# Patient Record
Sex: Female | Born: 1950 | Race: Black or African American | Hispanic: No | Marital: Single
Health system: Midwestern US, Community
[De-identification: ages and names within clinical notes are randomized; demographics above are authoritative.]

## PROBLEM LIST (undated history)

## (undated) DIAGNOSIS — R053 Chronic cough: Secondary | ICD-10-CM

## (undated) DIAGNOSIS — M51369 Other intervertebral disc degeneration, lumbar region without mention of lumbar back pain or lower extremity pain: Secondary | ICD-10-CM

## (undated) DIAGNOSIS — M5136 Other intervertebral disc degeneration, lumbar region: Secondary | ICD-10-CM

## (undated) DIAGNOSIS — R058 Other specified cough: Secondary | ICD-10-CM

## (undated) DIAGNOSIS — K7469 Other cirrhosis of liver: Principal | ICD-10-CM

## (undated) DIAGNOSIS — B1920 Unspecified viral hepatitis C without hepatic coma: Secondary | ICD-10-CM

## (undated) DIAGNOSIS — K7460 Unspecified cirrhosis of liver: Secondary | ICD-10-CM

## (undated) DIAGNOSIS — F329 Major depressive disorder, single episode, unspecified: Secondary | ICD-10-CM

## (undated) DIAGNOSIS — F32A Depression, unspecified: Secondary | ICD-10-CM

## (undated) HISTORY — PX: KNEE SURGERY: SHX244

## (undated) HISTORY — PX: ABDOMINAL HYSTERECTOMY: SHX81

## (undated) HISTORY — PX: HIP SURGERY: SHX245

## (undated) HISTORY — PX: BACK SURGERY: SHX140

---

## 1998-03-28 ENCOUNTER — Emergency Department (HOSPITAL_COMMUNITY): Admission: EM | Admit: 1998-03-28 | Discharge: 1998-03-28 | Payer: Self-pay | Admitting: Internal Medicine

## 1998-03-30 ENCOUNTER — Emergency Department (HOSPITAL_COMMUNITY): Admission: EM | Admit: 1998-03-30 | Discharge: 1998-03-30 | Payer: Self-pay | Admitting: Emergency Medicine

## 1998-04-03 ENCOUNTER — Emergency Department (HOSPITAL_COMMUNITY): Admission: EM | Admit: 1998-04-03 | Discharge: 1998-04-03 | Payer: Self-pay

## 2001-11-23 ENCOUNTER — Ambulatory Visit (HOSPITAL_COMMUNITY): Admission: RE | Admit: 2001-11-23 | Discharge: 2001-11-23 | Payer: Self-pay | Admitting: Family Medicine

## 2001-11-23 ENCOUNTER — Encounter: Payer: Self-pay | Admitting: Family Medicine

## 2001-12-31 ENCOUNTER — Encounter (HOSPITAL_BASED_OUTPATIENT_CLINIC_OR_DEPARTMENT_OTHER): Payer: Self-pay | Admitting: General Surgery

## 2002-01-04 ENCOUNTER — Encounter (INDEPENDENT_AMBULATORY_CARE_PROVIDER_SITE_OTHER): Payer: Self-pay | Admitting: *Deleted

## 2002-01-04 ENCOUNTER — Encounter (HOSPITAL_BASED_OUTPATIENT_CLINIC_OR_DEPARTMENT_OTHER): Payer: Self-pay | Admitting: General Surgery

## 2002-01-04 ENCOUNTER — Ambulatory Visit (HOSPITAL_COMMUNITY): Admission: RE | Admit: 2002-01-04 | Discharge: 2002-01-05 | Payer: Self-pay | Admitting: General Surgery

## 2002-07-12 ENCOUNTER — Emergency Department (HOSPITAL_COMMUNITY): Admission: EM | Admit: 2002-07-12 | Discharge: 2002-07-12 | Payer: Self-pay | Admitting: Emergency Medicine

## 2002-07-12 ENCOUNTER — Encounter: Payer: Self-pay | Admitting: Emergency Medicine

## 2002-08-14 ENCOUNTER — Ambulatory Visit (HOSPITAL_COMMUNITY): Admission: RE | Admit: 2002-08-14 | Discharge: 2002-08-14 | Payer: Self-pay | Admitting: *Deleted

## 2002-09-07 ENCOUNTER — Ambulatory Visit (HOSPITAL_COMMUNITY): Admission: RE | Admit: 2002-09-07 | Discharge: 2002-09-07 | Payer: Self-pay | Admitting: Orthopedic Surgery

## 2002-10-19 ENCOUNTER — Encounter: Admission: RE | Admit: 2002-10-19 | Discharge: 2002-12-28 | Payer: Self-pay | Admitting: Orthopedic Surgery

## 2003-02-09 ENCOUNTER — Emergency Department (HOSPITAL_COMMUNITY): Admission: EM | Admit: 2003-02-09 | Discharge: 2003-02-09 | Payer: Self-pay | Admitting: Emergency Medicine

## 2003-05-26 ENCOUNTER — Ambulatory Visit (HOSPITAL_COMMUNITY): Admission: RE | Admit: 2003-05-26 | Discharge: 2003-05-26 | Payer: Self-pay | Admitting: Family Medicine

## 2004-06-12 ENCOUNTER — Ambulatory Visit: Payer: Self-pay | Admitting: Family Medicine

## 2004-10-11 ENCOUNTER — Ambulatory Visit: Payer: Self-pay | Admitting: Internal Medicine

## 2004-10-31 ENCOUNTER — Ambulatory Visit: Payer: Self-pay | Admitting: Family Medicine

## 2005-05-29 ENCOUNTER — Ambulatory Visit: Payer: Self-pay | Admitting: Family Medicine

## 2005-06-18 ENCOUNTER — Ambulatory Visit: Payer: Self-pay | Admitting: *Deleted

## 2005-07-16 ENCOUNTER — Ambulatory Visit: Payer: Self-pay | Admitting: Family Medicine

## 2005-07-23 ENCOUNTER — Encounter: Admission: RE | Admit: 2005-07-23 | Discharge: 2005-09-13 | Payer: Self-pay | Admitting: Family Medicine

## 2005-08-20 ENCOUNTER — Ambulatory Visit: Payer: Self-pay | Admitting: Family Medicine

## 2005-09-24 ENCOUNTER — Ambulatory Visit: Payer: Self-pay | Admitting: Family Medicine

## 2005-09-25 ENCOUNTER — Ambulatory Visit (HOSPITAL_COMMUNITY): Admission: RE | Admit: 2005-09-25 | Discharge: 2005-09-25 | Payer: Self-pay | Admitting: Family Medicine

## 2005-10-28 ENCOUNTER — Ambulatory Visit (HOSPITAL_COMMUNITY): Admission: RE | Admit: 2005-10-28 | Discharge: 2005-10-28 | Payer: Self-pay | Admitting: Family Medicine

## 2005-10-28 ENCOUNTER — Ambulatory Visit: Payer: Self-pay | Admitting: Family Medicine

## 2005-11-26 ENCOUNTER — Ambulatory Visit: Payer: Self-pay | Admitting: Family Medicine

## 2005-12-20 ENCOUNTER — Ambulatory Visit: Payer: Self-pay | Admitting: Family Medicine

## 2006-01-30 ENCOUNTER — Ambulatory Visit: Payer: Self-pay | Admitting: Family Medicine

## 2006-03-12 ENCOUNTER — Ambulatory Visit: Payer: Self-pay | Admitting: Family Medicine

## 2006-04-24 ENCOUNTER — Ambulatory Visit: Payer: Self-pay | Admitting: Family Medicine

## 2006-05-28 ENCOUNTER — Ambulatory Visit: Payer: Self-pay | Admitting: Family Medicine

## 2006-06-02 ENCOUNTER — Ambulatory Visit (HOSPITAL_COMMUNITY): Admission: RE | Admit: 2006-06-02 | Discharge: 2006-06-02 | Payer: Self-pay | Admitting: Family Medicine

## 2006-06-23 ENCOUNTER — Ambulatory Visit: Payer: Self-pay | Admitting: Family Medicine

## 2006-08-12 ENCOUNTER — Ambulatory Visit: Payer: Self-pay | Admitting: Family Medicine

## 2006-09-23 ENCOUNTER — Ambulatory Visit: Payer: Self-pay | Admitting: Family Medicine

## 2006-10-28 ENCOUNTER — Ambulatory Visit: Payer: Self-pay | Admitting: Family Medicine

## 2006-12-05 ENCOUNTER — Ambulatory Visit: Payer: Self-pay | Admitting: Family Medicine

## 2007-01-09 ENCOUNTER — Ambulatory Visit: Payer: Self-pay | Admitting: Family Medicine

## 2007-01-13 ENCOUNTER — Inpatient Hospital Stay (HOSPITAL_COMMUNITY): Admission: RE | Admit: 2007-01-13 | Discharge: 2007-01-16 | Payer: Self-pay | Admitting: Orthopedic Surgery

## 2007-02-20 ENCOUNTER — Ambulatory Visit: Payer: Self-pay | Admitting: Family Medicine

## 2007-03-31 ENCOUNTER — Ambulatory Visit: Payer: Self-pay | Admitting: Family Medicine

## 2007-04-15 ENCOUNTER — Encounter (INDEPENDENT_AMBULATORY_CARE_PROVIDER_SITE_OTHER): Payer: Self-pay | Admitting: *Deleted

## 2007-08-06 ENCOUNTER — Ambulatory Visit: Payer: Self-pay | Admitting: Family Medicine

## 2007-09-10 ENCOUNTER — Encounter: Admission: RE | Admit: 2007-09-10 | Discharge: 2007-09-10 | Payer: Self-pay | Admitting: Orthopedic Surgery

## 2007-10-20 ENCOUNTER — Encounter: Admission: RE | Admit: 2007-10-20 | Discharge: 2007-10-20 | Payer: Self-pay | Admitting: Gastroenterology

## 2007-10-25 ENCOUNTER — Encounter: Admission: RE | Admit: 2007-10-25 | Discharge: 2007-10-25 | Payer: Self-pay | Admitting: Gastroenterology

## 2007-12-01 ENCOUNTER — Encounter: Admission: RE | Admit: 2007-12-01 | Discharge: 2007-12-31 | Payer: Self-pay | Admitting: Orthopedic Surgery

## 2008-03-21 ENCOUNTER — Encounter: Admission: RE | Admit: 2008-03-21 | Discharge: 2008-05-19 | Payer: Self-pay | Admitting: Orthopedic Surgery

## 2009-03-16 ENCOUNTER — Ambulatory Visit: Payer: Self-pay | Admitting: Obstetrics and Gynecology

## 2010-03-06 ENCOUNTER — Encounter
Admission: RE | Admit: 2010-03-06 | Discharge: 2010-03-06 | Payer: Self-pay | Admitting: Physical Medicine and Rehabilitation

## 2010-12-11 NOTE — Op Note (Signed)
Amanda Monroe, Amanda Monroe NO.:  [id number]   MEDICAL RECORD NO.:  [id number]          PATIENT TYPE:  INP   LOCATION:  2855                         FACILITY:  [facility]   PHYSICIAN:  Nelda Severe, MD      DATE OF BIRTH:  1950/09/29   DATE OF PROCEDURE:  01/13/2007  DATE OF DISCHARGE:                               OPERATIVE REPORT   SURGEON:  Nelda Severe, M.D.   ASSISTANT:  Lianne Cure, P.A.-C.   PREOPERATIVE DIAGNOSIS:  Lumbar spondylosis, lumbar spinal stenosis L4-  L5.   POSTOPERATIVE DIAGNOSIS:  Lumbar spondylosis, lumbar spinal stenosis L4-  L5.   OPERATIVE PROCEDURE:  L4-L5 bilateral laminectomy; posterior  posterolateral fusion L3-L4, L4-L5, L5-S1 with autogenous iliac crest  graft (right side), screws and rods.   OPERATIVE NOTE:  The patient was placed under general endotracheal  anesthesia.  A Foley catheter was placed in the bladder.  Sequential  compression devices were placed on both lower extremities.  A gram of  vancomycin had been infused intravenously.  The patient was positioned  prone on the [place] table.  Care was taken to position the upper  extremities so as to avoid hyperflexion and abduction of the shoulders  so as to avoid hyperflexion of the elbows.  The upper extremities were  padded from hands to axilla with foam.  The thighs, knees, shins, and  feet were padded with pillows.  The midline in the lumbosacral area was  marked on the skin with a skin marker as well as an oblique mark made on  the right side over the posterior iliac crest.  The lumbar area was  prepped with DuraPrep and draped in rectangular fashion.  The drapes  were secured with Ioban.   A midline incision was scored into the dermis and subcutaneous tissue  injected a mixture of 0.25% Marcaine with epinephrine and 1% plain  lidocaine.  Dissection was carried down to the spinous processes using  cutting current.  The paraspinal muscles were reflected bilaterally to  the transverse processes of L3, L4, L5 and the ala of the sacrum  bilaterally.  Cross table lateral radiograph was taken with Kochers  marking the spinous processes.  This confirmed our level.   Pedicle holes were then made on the left side at S1, L5, L4 and L3, in  the usual fashion:  A small amount of the base of the supra-articular  process was removed with a Leksell rongeur to identify the posterior  pedicle.  The pedicle was then perforated with an awl and a 3.5 mm drill  bit used to make a hole through the pedicle in the vertebral body.  Each  hole was carefully palpated with a ball tip probe to make sure it was  circumferentially intact and sounded for depths and the depths recorded.  Each hole was then injected with FloSeal and a radio-opaque marker  placed.  I then switched to the right side of the patient and made holes  again at the same levels S1 distally through L3 proximally to the  fashion described.  Cross table lateral radiograph was taken.  While waiting for the radiograph to be developed, a right posterior  iliac crest graft was harvested through a separate incision.  An oblique  incision was made just lateral to the iliac crest.  The gluteal fascia  and subcutaneous layer were injected with the same mixture of local  anesthetic mentioned above.  Dissection was carried down until the  gluteal fascial insertion into the iliac crest was identified and it was  detached using cutting current.  Muscle was elevated off the outer table  of the ilium.  Acetabular reamers were then used to remove a moderately  large quantity of graft.  The wound was irrigated with saline, packed  with Gelfoam, and then closed with three figure-of-eight #1 Vicryl  sutures through the fascia.   We then prepared the ala of the sacrum and the transverse process of L5,  L4, L3, and the lateral aspect of the supra-articular processes for  placement of graft.  They were decorticated using a  combination of  Leksell rongeur and osteotomes.  Half the graft was then placed from the  ala of the sacrum distally to the L3 transverse process and lateral  aspect of the supra-articular process proximally on the right side.  Screws were then inserted from S1 distally through L3 proximally.  At  S1, the 7.5 mm screw stripped out and a larger 8 mm screw was then  inserted and then broad provisionally attached.  Cross-table lateral  radiograph was taken which showed satisfactory position of screws.  We  performed the same procedure on the left side, decorticating the ala of  the sacrum and the transverse processes and lateral aspects of the supra-  articular processes.  The rest of the graft was placed posterolaterally  from L3 through S1 on the left side.  Screws were then inserted and then  rod contoured and placed.  Cross-table lateral radiograph showed  satisfactory position of the screws.  All the couplings were then  torqued on both sides.  FloSeal was injected over the laminectomy defect  after it was carefully inspected to make sure that no graft had gone  into it.   I failed to dictation in the proper order the laminectomy.  This was  done subsequent to placing all the screw holes, but prior to placing  hardware.  We used an acetabular reamer to thin down the lamina,  particularly at L4-L5, in order to harvest more graft.  We then  completed the laminectomy bilaterally at L4 including facetectomies.  The L4 nerve roots bilaterally were well decompressed into the neural  foramina and the L5 nerve roots decompressed on the right side and  lateral recess at L5.  A probe could be admitted on the left side and  there was no need to perform a decompression.   We then carried on with harvest of graft and insertion of screws as  dictated above.   Subsequent to torquing all the couplings and having a final x-ray, a 15  gauge Blake drain was placed subfascially and brought out through  the  skin through the right side.  It was secured with a 2-0 nylon suture in  basket weave fashion.  The thoracolumbar fascia was then closed using  continuous #1 Vicryl suture in a running fashion.  A 1/8 inch Hemovac  drain was placed in the subcutaneous layer of the central wound and  brought out through the bone graft harvest wound laterally and through  the skin proximally.  The drain was  secured with a 2-0 nylon suture.  The subcutaneous layer of both wounds was closed using interrupted  inverted 2-0 Vicryl suture.  The skin of both wounds was closed using  continuous subcuticular 3-0 undyed Vicryl suture.  The skin edges were  reinforced with Steri-Strips and an antibiotic ointment dressing applied  and secured with OpSite.   The blood loss estimated at 250 mL.  There was not enough blood to spin  down in the Cell Saver.  The patient was stable throughout the  procedure.  There were no intraoperative complications.  Sponge and  needle counts were correct.      Nelda Severe, MD  Electronically Signed     MT/MEDQ  D:  01/13/2007  T:  01/13/2007  Job:  [id number]

## 2010-12-11 NOTE — Group Therapy Note (Signed)
NAME:  Amanda Monroe, Amanda Monroe NO.:  [id number]   MEDICAL RECORD NO.:  [id number]          PATIENT TYPE:  WOC   LOCATION:  [facility]                   FACILITY:  [facility]   PHYSICIAN:  Deirdre Poe, CNM       DATE OF BIRTH:  Jun 28, 1951   DATE OF SERVICE:                                  CLINIC NOTE   HISTORY:  She is concerned that she has not had any ongoing primary care  and was last seen by a physician about 4 years ago, at which time she  had a normal Pap smear; however, she has had a normal mammogram this  year, she thinks a couple of months ago.  Her other concern is hot  flashes which she has had from time to time over the past several years  but for the last year or so they think it is becoming more frequent.  She thinks she has about 4 or 5 episodes a day and also wakes up  sweating at night.   ALLERGIES:  None.   MEDICATIONS:  Hydrocodone and occasional use for back and hip pain  postsurgical.  Zolpidem 10 mg 1 a day.   IMMUNIZATIONS:  Usual childhood immunizations including tetanus.   MENSTRUAL HISTORY:  Not applicable.   CONTRACEPTIVES:  None.  She is not currently sexually active and lives  alone.   OBSTETRIC HISTORY:  She had three term vaginal deliveries.  Her middle  son deceased at age 64 of meningitis.   GYNECOLOGIC HISTORY:  She believes she had a Pap smear done at  [facility] about 4 years ago and has never had an abnormal.  She does  give her date of last mammogram as August 2010.  She had a colonoscopy  in 2006.  She had a partial hysterectomy in 1976 which was done for  dysfunctional bleeding of benign nature.  She had extensive back surgery  and knee and hip surgery.  She plans to make an appointment with her  orthopedist in the near future as her symptoms are recurring.   Family history is significant for father with prostate cancer, one  brother with lung cancer, one with colon cancer.   Personal medical history is significant problem in  her back that  required surgical repair and a knee surgery for a benign mass.   SOCIAL HISTORY:  Does not work.  Quit smoking several years ago, has  about 10 pack-years.  Does not drink or use any caffeinated beverages.  No history of abuse.   REVIEW OF SYSTEMS:  Twelve point is positive for hot flashes as above  and some weight fluctuations that sound minor of just a few pounds.   PHYSICAL EXAMINATION:  VITAL SIGNS:  Temp 97.7, pulse 75, BP 120/81,  weight 163.  GENERAL:  Pleasant AA female in no distress.  HEENT:  Atraumatic, normocephalic.  NECK:  Thyroid not enlarged.  HEART:  RRR without murmur.  LUNGS:  CTA bilateral.  BREASTS:  There is a symmetric shallow indentation horizontally across  both areola, and she states this has been present and unchanged for  years.  No other discrete dimpling.  No discrete masses or  lymphadenopathy.  ABDOMEN:  Soft, nontender.  No masses palpated.  BACK:  Scarring from past spinal surgeries.  EXTREMITIES:  Without edema.  PELVIC:  External genitalia is significant for 2-mm, flat, black nevus  versus comedones.  The patient states it has been there for months and  has not changed.  It is in the mid pubic area.  Otherwise, NEFG.  Vagina, pale, decrease rugae.  Cuff visualized.  Minimal discharge.  Palpation of vulva and vagina without masses.   ASSESSMENT:  Menopausal with significant hot flashes.   HEALTHCARE MAINTENANCE:  Needs to begin vitamins and will get labs as  well.   Plan is for starting multivitamin.  She can continue the vitamin E which  she takes sporadically.  We advised calcium 1200 mg per day and D 3000  mg a day.  Discussed diet, exercise, and weight loss.  She needs to  increase fruits and vegetables.  Decrease saturated fats and junk food.  Needs to start doing some cardia and strength exercises and reviewed  breast self-exam.  Continue to use seat belt all the time.  In  consultation with Dr. Okey Dupre, we will start her  on estradiol 1 mg p.o.  daily and have her return in 2 months to see how she is doing.  Meanwhile, she will get a fasting lipid profile, TSH, hemoglobin A1c,  and CBC.           ______________________________  Caren Griffins, CNM     DP/MEDQ  D:  03/16/2009  T:  03/17/2009  Job:  [id number]

## 2010-12-11 NOTE — Discharge Summary (Signed)
NAMEALISSANDRA, GEOFFROY NO.:  [id number]   MEDICAL RECORD NO.:  [id number]          PATIENT TYPE:  INP   LOCATION:  5029                         FACILITY:  [facility]   PHYSICIAN:  Nelda Severe, MD      DATE OF BIRTH:  05-28-1951   DATE OF ADMISSION:  01/13/2007  DATE OF DISCHARGE:  01/16/2007                               DISCHARGE SUMMARY   This 60 year old lady is admitted for management of spinal stenosis and  lumbar spondylosis.  At admission, she was taken to the operating room  where an L4-5 laminectomy and L3-S1 fusion was carried out using  hypnogenous bone graft in the right posterior iliac crest.  Postoperatively, there have been no complications and there were no  intraoperative complications.  She has ambulated well with the use of a  walker.  At the present time, she is tolerating a regular diet, has  passed flatus and is ambulatory with a walker.   At this time, she is being discharged with a walker and a 3:1 commode.  The dressing has been changed this morning.  Drains were removed  previously.  The incision is dry without drainage.   [facility] has been contacted with regards to her DNEs and a  home health aide.   She will followed in the office in approximately 1 month's time.  She is  to avoid bending and lifting.  She can walk as much as she wants.  She  is being discharged home with a prescription for Norco 10 one to two  q.h.s., maximum 6 per day with 120 tablets.  She already has Flexeril at  home.  She will take 10 mg p.r.n. for spasms.   FINAL DIAGNOSIS:  Lumbar spondylosis, lumbar spinal stenosis.   See above for discharge instructions and DNEs.   She is also to call us for any wound drainage or persistent fever and  she will be seen sooner.      Nelda Severe, MD  Electronically Signed     MT/MEDQ  D:  01/16/2007  T:  01/16/2007  Job:  [id number]

## 2010-12-11 NOTE — H&P (Signed)
Amanda Monroe, Amanda Monroe             ACCOUNT NO.:  [id number]   MEDICAL RECORD NO.:  [id number]          PATIENT TYPE:  INP   LOCATION:  NA                           FACILITY:  [facility]   PHYSICIAN:  Lianne Cure, P.A.  DATE OF BIRTH:  1950/08/10   DATE OF ADMISSION:  DATE OF DISCHARGE:                              HISTORY & PHYSICAL   CHIEF COMPLAINT:  Lower back pain, right buttock pain, occasionally  radiating into right greater than left leg.   HISTORY OF PRESENT ILLNESS:  She came to [place] on October 15, 2006, chief  complaint of low back pain, right buttock pain going on since 2006,  worsening in 2007 when a swing broke on her.  She fell to the ground.  She was seen at the request of her family physician, Dr. Dow Adolph.   ALLERGIES:  She has no known drug allergies.   CURRENT MEDICATIONS:  1. Neurontin 300 mg at bedtime.  2. Percocet p.r.n. for pain control.  3. Flexeril 10 mg p.r.n. for muscle spasms.  4. Mobic.   PAST MEDICAL HISTORY:  1. Anxiety.  2. Arthritis.  3. Asthma.  4. Depression.  5. Liver disease.   PAST SURGICAL HISTORY:  1. Left knee in 2004.  2. Hysterectomy in 1976.   SOCIAL HISTORY:  She does not smoke or drink.   FAMILY HISTORY:  Includes prostate cancer and lung disease in her  brother.   REVIEW OF SYSTEMS:  She reports no recent weight loss or weight gain.  She does wear reading glasses.  She has occasional nose bleeds.  No  change in vision or hearing.  No difficulty swallowing.  No shortness of  breath.  No chest pain.  No poor appetite.  She does have some gum  trouble and toothaches occasionally.  No nausea, vomiting or diarrhea.  No melena.  No seizures.  No blackouts.  No frequent headaches.   PHYSICAL EXAMINATION:  VITAL SIGNS:  Today, her temperature was 97.7,  pulse 81, respirations 18, blood pressure 127/77.  She is 5 foot 7,  weighs 175 pounds.  GENERAL:  She is well-developed, well-nourished.  HEENT:  Pupils are equal,  round, reactive to light.  NECK:  Has full range of motion actively.  It is supple, nontender to  palpation.  CHEST:  Clear to auscultation.  No wheezing.  HEART:  Regular rate and rhythm.  No murmurs.  ABDOMEN:  Soft.  Positive bowel sounds on auscultation.  EXTREMITIES:  Today on exam are neurovascular and motor intact  bilaterally equal.  SKIN:  Clean and dry.  No openings.  No poor healings.   MRI RESULTS:  She has spinal stenosis L3-4, L4-5, with spondylosis at L5-  S1.   PLAN:  Lumbar effusion L3-S1 with laminectomy at L4-5, L3-4 with iliac  crest bone graft by Dr. Herbert Pun on January 13, 2007.      Lianne Cure, P.A.     MC/MEDQ  D:  01/12/2007  T:  01/12/2007  Job:  [id number]

## 2011-01-31 ENCOUNTER — Emergency Department (HOSPITAL_COMMUNITY): Payer: Medicaid Other

## 2011-01-31 ENCOUNTER — Emergency Department (HOSPITAL_COMMUNITY)
Admission: EM | Admit: 2011-01-31 | Discharge: 2011-01-31 | Disposition: A | Discharge status: Home / Self Care | Payer: Medicaid Other | Attending: Emergency Medicine | Admitting: Emergency Medicine

## 2011-01-31 DIAGNOSIS — M545 Low back pain, unspecified: Secondary | ICD-10-CM | POA: Insufficient documentation

## 2011-01-31 DIAGNOSIS — M25569 Pain in unspecified knee: Secondary | ICD-10-CM | POA: Insufficient documentation

## 2011-01-31 DIAGNOSIS — W010XXA Fall on same level from slipping, tripping and stumbling without subsequent striking against object, initial encounter: Secondary | ICD-10-CM | POA: Insufficient documentation

## 2011-01-31 DIAGNOSIS — Z981 Arthrodesis status: Secondary | ICD-10-CM | POA: Insufficient documentation

## 2011-05-15 LAB — BASIC METABOLIC PANEL
BUN: 3 — ABNORMAL LOW
BUN: 6
CO2: 28
Calcium: 8.1 — ABNORMAL LOW
Calcium: 8.3 — ABNORMAL LOW
Calcium: 8.5
Calcium: 8.5
Chloride: 108
Chloride: 108
Creatinine, Ser: 0.57
Creatinine, Ser: 0.62
GFR calc non Af Amer: >60
GFR calc non Af Amer: >60
Glucose, Bld: 107 — ABNORMAL HIGH
Glucose, Bld: 111 — ABNORMAL HIGH
Glucose, Bld: 118 — ABNORMAL HIGH
Glucose, Bld: 135 — ABNORMAL HIGH
Potassium: 3.9
Sodium: 139
Sodium: 139
Sodium: 142
Sodium: 144

## 2011-05-15 LAB — URINALYSIS, ROUTINE W REFLEX MICROSCOPIC
Hgb urine dipstick: NEGATIVE
Ketones, ur: NEGATIVE
Leukocytes, UA: NEGATIVE
Nitrite: NEGATIVE
Urobilinogen, UA: 4 — ABNORMAL HIGH

## 2011-05-15 LAB — CBC
HCT: 29.9 — ABNORMAL LOW
HCT: 30.7 — ABNORMAL LOW
HCT: 42.8
Hemoglobin: 10 — ABNORMAL LOW
Hemoglobin: 10.3 — ABNORMAL LOW
MCHC: 33.4
MCHC: 33.5
MCHC: 33.5
MCHC: 33.8
MCV: 94.7
MCV: 96.3
MCV: 97.6
Platelets: 128 — ABNORMAL LOW
Platelets: 161
RBC: 3.19 — ABNORMAL LOW
RDW: 12.1
RDW: 12.1
RDW: 12.2
RDW: 12.2
WBC: 4.5

## 2011-05-15 LAB — DIFFERENTIAL
Basophils Relative: 1
Eosinophils Absolute: 0.1
Eosinophils Relative: 3
Lymphocytes Relative: 43
Lymphs Abs: 1.9
Monocytes Absolute: 0.5
Monocytes Relative: 10
Neutro Abs: 1.9
Neutrophils Relative %: 43

## 2011-05-15 LAB — URINE CULTURE: Culture: NO GROWTH

## 2011-05-15 LAB — COMPREHENSIVE METABOLIC PANEL
Calcium: 9.7
Chloride: 105
GFR calc non Af Amer: >60
Potassium: 4.5
Sodium: 142
Total Bilirubin: 0.8
Total Protein: 7.3

## 2011-05-15 LAB — APTT: aPTT: 31

## 2011-12-17 ENCOUNTER — Emergency Department (HOSPITAL_COMMUNITY): Payer: Medicaid Other

## 2011-12-17 ENCOUNTER — Emergency Department (HOSPITAL_COMMUNITY)
Admission: EM | Admit: 2011-12-17 | Discharge: 2011-12-18 | Disposition: A | Discharge status: Home / Self Care | Payer: Medicaid Other | Attending: Emergency Medicine | Admitting: Emergency Medicine

## 2011-12-17 DIAGNOSIS — S161XXA Strain of muscle, fascia and tendon at neck level, initial encounter: Secondary | ICD-10-CM

## 2011-12-17 DIAGNOSIS — M546 Pain in thoracic spine: Secondary | ICD-10-CM | POA: Insufficient documentation

## 2011-12-17 DIAGNOSIS — S139XXA Sprain of joints and ligaments of unspecified parts of neck, initial encounter: Secondary | ICD-10-CM | POA: Insufficient documentation

## 2011-12-17 DIAGNOSIS — G8929 Other chronic pain: Secondary | ICD-10-CM | POA: Insufficient documentation

## 2011-12-17 DIAGNOSIS — M25559 Pain in unspecified hip: Secondary | ICD-10-CM | POA: Insufficient documentation

## 2011-12-17 DIAGNOSIS — M545 Low back pain, unspecified: Secondary | ICD-10-CM | POA: Insufficient documentation

## 2011-12-17 DIAGNOSIS — M542 Cervicalgia: Secondary | ICD-10-CM | POA: Insufficient documentation

## 2011-12-17 DIAGNOSIS — S0990XA Unspecified injury of head, initial encounter: Secondary | ICD-10-CM | POA: Insufficient documentation

## 2011-12-17 DIAGNOSIS — F411 Generalized anxiety disorder: Secondary | ICD-10-CM | POA: Insufficient documentation

## 2011-12-17 DIAGNOSIS — S335XXA Sprain of ligaments of lumbar spine, initial encounter: Secondary | ICD-10-CM | POA: Insufficient documentation

## 2011-12-17 DIAGNOSIS — S39012A Strain of muscle, fascia and tendon of lower back, initial encounter: Secondary | ICD-10-CM

## 2011-12-17 MED ORDER — OXYCODONE-ACETAMINOPHEN 5-325 MG PO TABS
2.0000 | ORAL_TABLET | Freq: Once | ORAL | Status: AC
  Administered 2011-12-18: 2 via ORAL
  Filled 2011-12-17: qty 2
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
## 2011-12-18 ENCOUNTER — Emergency Department (HOSPITAL_COMMUNITY): Payer: Medicaid Other

## 2011-12-18 ENCOUNTER — Encounter (HOSPITAL_COMMUNITY): Payer: Self-pay | Admitting: Emergency Medicine
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
## 2012-01-10 ENCOUNTER — Other Ambulatory Visit: Payer: Self-pay | Admitting: Nephrology

## 2012-02-20 ENCOUNTER — Encounter (HOSPITAL_COMMUNITY): Payer: Self-pay

## 2012-02-20 ENCOUNTER — Emergency Department (HOSPITAL_COMMUNITY)
Admission: EM | Admit: 2012-02-20 | Discharge: 2012-02-20 | Disposition: A | Discharge status: Home / Self Care | Payer: Medicaid Other | Attending: Emergency Medicine | Admitting: Emergency Medicine

## 2012-02-20 ENCOUNTER — Emergency Department (HOSPITAL_COMMUNITY): Payer: Medicaid Other

## 2012-02-20 DIAGNOSIS — R0989 Other specified symptoms and signs involving the circulatory and respiratory systems: Secondary | ICD-10-CM

## 2012-02-20 DIAGNOSIS — R6889 Other general symptoms and signs: Secondary | ICD-10-CM | POA: Insufficient documentation

## 2012-02-20 HISTORY — DX: Depression, unspecified: F32.A

## 2012-02-20 HISTORY — DX: Major depressive disorder, single episode, unspecified: F32.9

## 2012-02-20 MED ORDER — DIAZEPAM 5 MG/ML IJ SOLN
5.0000 mg | Freq: Once | INTRAMUSCULAR | Status: AC
  Administered 2012-02-20: 5 mg via INTRAVENOUS
  Filled 2012-02-20: qty 2

## 2012-02-20 MED ORDER — LIDOCAINE VISCOUS 2 % MT SOLN: 20.0000 mL | OROMUCOSAL | Status: AC | PRN

## 2012-02-20 MED ORDER — GI COCKTAIL ~~LOC~~
30.0000 mL | Freq: Once | ORAL | Status: AC
  Administered 2012-02-20: 30 mL via ORAL
  Filled 2012-02-20: qty 30

## 2012-02-20 NOTE — ED Provider Notes (Signed)
History     CSN: [id number]  Arrival date & time 02/20/12  1336   First MD Initiated Contact with Patient 02/20/12 1347      Chief Complaint  Patient presents with  . Foreign Body    (Consider location/radiation/quality/duration/timing/severity/associated sxs/prior treatment) HPI Comments: Patient presents with foreign bodies sensation in the throat. She was eating fish at a restaurant and fell at 1 bone got stuck. She denies any shortness of breath, difficulty breathing or swallowing. She's been trying to make herself vomit by sticking her finger down her throat. She denies any chest pain or shortness of breath.  Patient is a 61 y.o. female presenting with foreign body. The history is provided by the patient and the EMS personnel.  Foreign Body  The current episode started less than 1 hour ago. The foreign body is suspected to be swallowed. The foreign body is food. The incident was suspected. The incident was witnessed/reported by the patient. Associated symptoms include sore throat. Pertinent negatives include no chest pain, no fever, no abdominal pain, no vomiting, no drooling, no trouble swallowing and no cough.    Past Medical History  Diagnosis Date  . Depression     Past Surgical History  Procedure Date  . Back surgery   . Knee surgery     No family history on file.  History  Substance Use Topics  . Smoking status: Never Smoker   . Smokeless tobacco: Not on file  . Alcohol Use: No     drinks occasionally    OB History    Grav Para Term Preterm Abortions TAB SAB Ect Mult Living                  Review of Systems  Constitutional: Negative for fever, activity change and appetite change.  HENT: Positive for sore throat. Negative for drooling and trouble swallowing.   Respiratory: Negative for cough, chest tightness and shortness of breath.   Cardiovascular: Negative for chest pain.  Gastrointestinal: Negative for nausea, vomiting and abdominal pain.    Genitourinary: Negative for dysuria.  Musculoskeletal: Negative for back pain and arthralgias.  Skin: Negative for rash.  Neurological: Negative for dizziness, facial asymmetry and headaches.    Allergies  Review of patient's allergies indicates no known allergies.  Home Medications   Current Outpatient Rx  Name Route Sig Dispense Refill  . OXYCODONE HCL ER 10 MG PO TB12 Oral Take 10 mg by mouth every 12 (twelve) hours.    Marland Kitchen TEMAZEPAM 30 MG PO CAPS Oral Take 30 mg by mouth at bedtime as needed. For sleep    . LIDOCAINE VISCOUS 2 % MT SOLN Oral Take 20 mLs by mouth as needed for pain. 100 mL 0    BP 123/61  Pulse 84  Temp 99.1 F (37.3 C) (Oral)  Resp 20  Ht 5\' 3"  (1.6 m)  Wt 178 lb (80.74 kg)  BMI 31.53 kg/m2  SpO2 98%  Physical Exam  Constitutional: She is oriented to person, place, and time. She appears well-developed and well-nourished. No distress.       Speaking in full senses, controlling secretions, no distress  HENT:  Head: Normocephalic and atraumatic.  Mouth/Throat: Oropharynx is clear and moist. No oropharyngeal exudate.       Oropharynx clear, no asymmetry, no foreign body visible, no tongue elevation  Eyes: Conjunctivae are normal. Pupils are equal, round, and reactive to light.  Neck: Normal range of motion. Neck supple.  Cardiovascular: Normal rate, regular rhythm  and normal heart sounds.   Pulmonary/Chest: Effort normal and breath sounds normal. No respiratory distress.  Abdominal: Soft. There is no tenderness. There is no rebound and no guarding.  Musculoskeletal: Normal range of motion. She exhibits no edema and no tenderness.  Neurological: She is alert and oriented to person, place, and time. No cranial nerve deficit.  Skin: Skin is warm.    ED Course  Procedures (including critical care time)  Labs Reviewed - No data to display Dg Neck Soft Tissue  02/20/2012  *RADIOLOGY REPORT*  Clinical Data: Difficulty swallowing.  Ingested foreign body.   NECK SOFT TISSUES - 1+ VIEW  Comparison: None.  Findings: Epiglottis and aryepiglottic folds appear within normal limits.  Faint calcification is present anterior to the C5-C6. This is most compatible with calcification of the thyroid cartilage.  No radiopaque foreign body is identified.  Prevertebral soft tissues are within normal limits in thickness.  Moderate cervical spondylosis is present in multiple levels.  IMPRESSION: No radiopaque foreign body identified.  Original Report Authenticated By: Andreas Newport, M.D.   Dg Chest 2 View  02/20/2012  *RADIOLOGY REPORT*  Clinical Data: Swallowed a fish bone today, difficulty swallowing  CHEST - 2 VIEW  Comparison: 01/12/2007  Findings: Normal heart size, mediastinal contours, and pulmonary vascularity. Chronic peribronchial thickening. No pulmonary infiltrate, pleural effusion or pneumothorax. Right apex scarring stable. No acute osseous findings. No radiopaque foreign body identified.  IMPRESSION: Mild chronic bronchitic changes. Stable right apical scarring. No acute abnormalities.  Original Report Authenticated By: Lollie Marrow, M.D.   Ct Soft Tissue Neck Wo Contrast  02/20/2012  *RADIOLOGY REPORT*  Clinical Data: Rule out fish bone.  Pain in neck after eating.  CT NECK WITHOUT CONTRAST  Technique:  Multidetector CT imaging of the neck was performed without intravenous contrast.  Comparison: None.  Findings: Negative for radiopaque foreign body in the soft tissues. Tonsils are normal.  Para pharyngeal soft tissues are normal.  No abscess or edema is present.  Negative for mass lesion  The larynx is normal.  Prevertebral soft tissues are normal.  The upper esophagus is normal extending down to the carina.  Negative for mass or adenopathy.  Lung apices are clear.  The paranasal sinuses are clear.  Cervical spondylosis and facet degeneration.  No acute bony abnormality.  IMPRESSION: Negative for  fish bone or retained foreign body in the soft tissues.  Original  Report Authenticated By: Camelia Phenes, M.D.     1. Foreign body sensation in throat       MDM  Suspected aspiration of fishbone.  Vital stable, no distress, no drooling or difficulty breathing or swallowing.  No foreign body seen on x-ray. Patient controlling secretions and swallow without problem. She reports improvement in her globus sensation with medications but not resolved.  Will move to CDU for further imaging including CT scan to evaluate for retained fishbone      Glynn Octave, MD 02/20/12 1625

## 2012-02-20 NOTE — ED Provider Notes (Signed)
Care of pt assumed in CDU. Pt moved to CDU to await CT soft tissue of neck. She thought she may have swallowed a fish bone. Plain film studies negative. Pt's CT is also negative. She reports that her foreign body sensation is relieved after receiving GI cocktail in dept. Drinking water without difficulty. Pt given small rx for viscous lidocaine should she have discomfort this pm. Instructed to f/u with PCP prn. Reasons to return discussed.  Grant Fontana, PA-C 02/20/12 1627

## 2012-02-20 NOTE — ED Provider Notes (Signed)
Medical screening examination/treatment/procedure(s) were conducted as a shared visit with non-physician practitioner(s) and myself.  I personally evaluated the patient during the encounter   Amanda Octave, MD 02/20/12 [phone]

## 2012-02-20 NOTE — ED Notes (Signed)
Patient stated that she still feels like the bone is in her throat but better. NAD
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
## 2013-07-16 IMAGING — CT CT NECK W/O CM
4 of 5 series · 9 of 20 positions shown, 10 images · non-contrast
Comparison: None.

CLINICAL DATA: Rule out fish bone.  Pain in neck after eating.

CT NECK WITHOUT CONTRAST
TECHNIQUE: Multidetector CT imaging of the neck was performed
without intravenous contrast.

[Series 2: 2cc/30ml and 1cc/45ml · axial · 0.50mm/px · z∈[-174,-92]mm · 2 of 99 slices shown]
[im 33/99  bone]
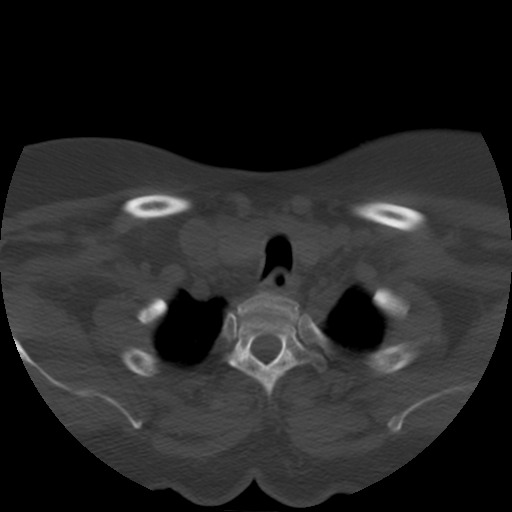
[im 66/99  bone]
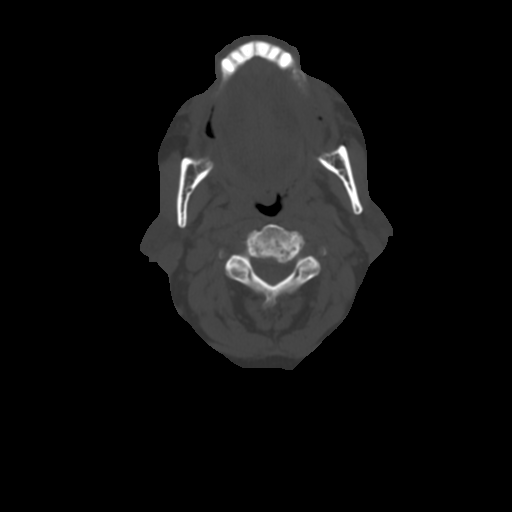

[Series 300: coronals · coronal · 0.50mm/px · 3 of 75 slices shown]
[im 15/75  bone]
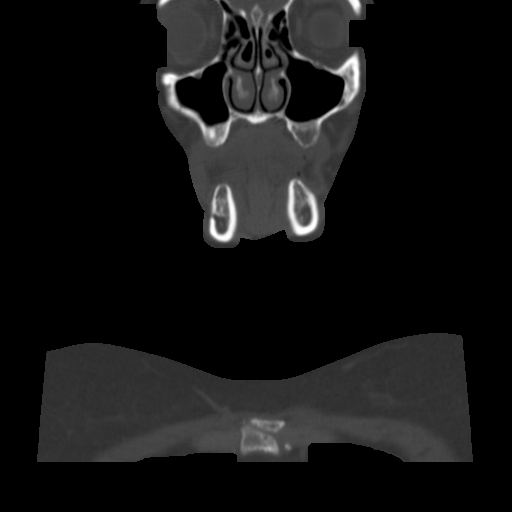
[im 30/75  bone]
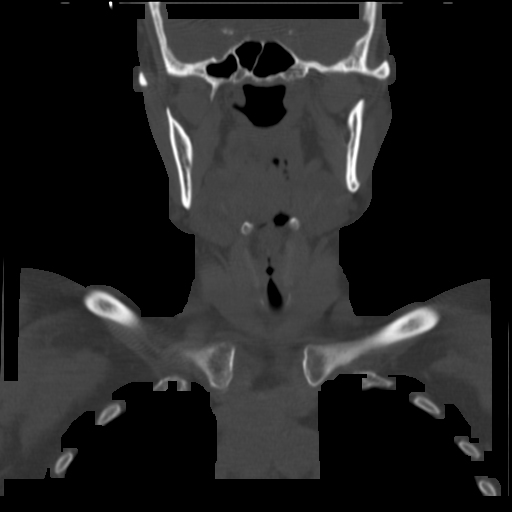
[im 45/75  bone]
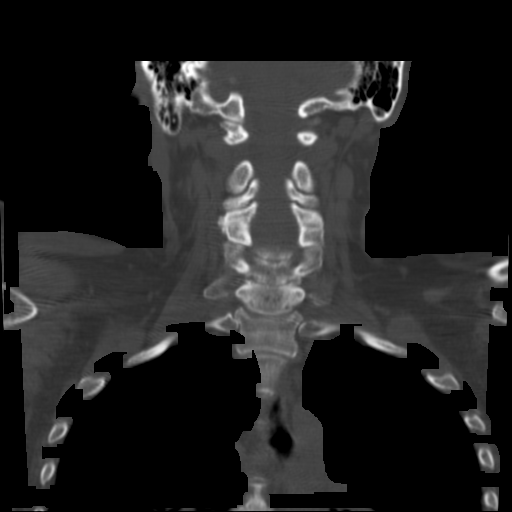

[Series 301: orthogs · axial · 0.50mm/px · z∈[-209,-138]mm · 2 of 75 slices shown, 3 images]
[im 25/75  soft-tissue]
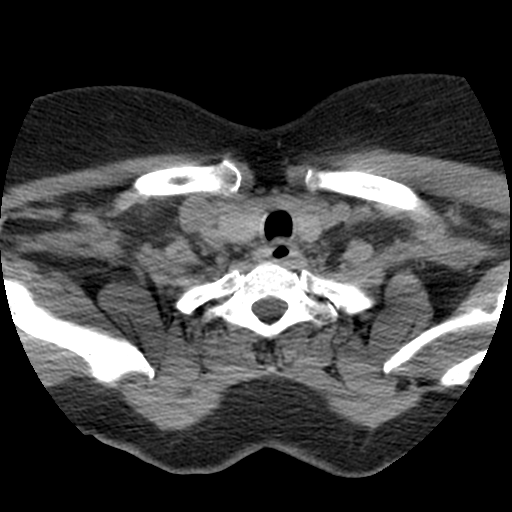
[im 25/75  bone]
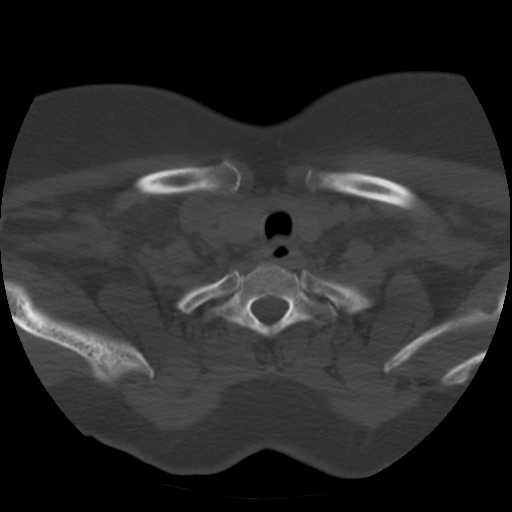
[im 50/75  bone]
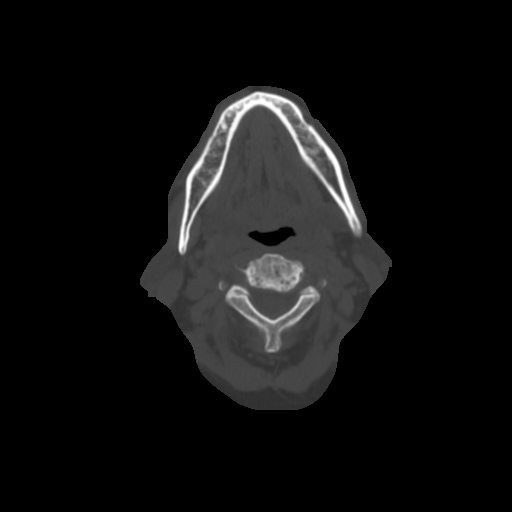

[Series 302: sagittals · sagittal · 0.50mm/px · 2 of 73 slices shown]
[im 25/73  bone]
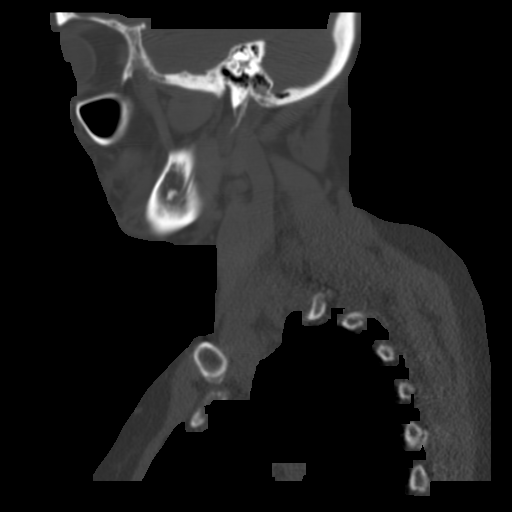
[im 49/73  bone]
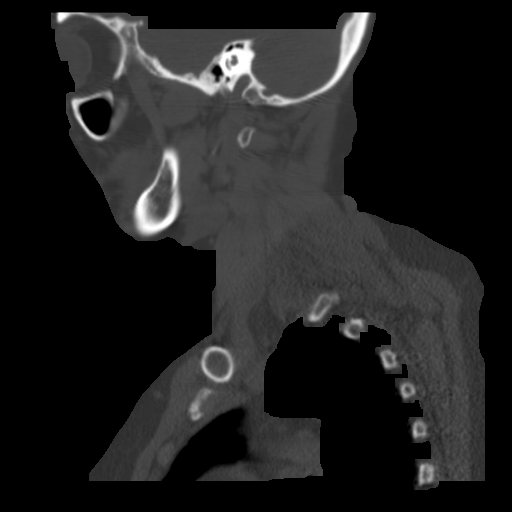

[9 of 20 positions shown; findings below may reference images not displayed]

FINDINGS: Negative for radiopaque foreign body in the soft tissues.
Tonsils are normal.  Para pharyngeal soft tissues are normal.  No
abscess or edema is present.  Negative for mass lesion

The larynx is normal.  Prevertebral soft tissues are normal.  The
upper esophagus is normal extending down to the carina.

Negative for mass or adenopathy.  Lung apices are clear.  The
paranasal sinuses are clear.

Cervical spondylosis and facet degeneration.  No acute bony
abnormality.
IMPRESSION: Negative for  fish bone or retained foreign body in the soft
tissues.

## 2015-12-27 DIAGNOSIS — G8929 Other chronic pain: Secondary | ICD-10-CM | POA: Diagnosis not present

## 2015-12-27 DIAGNOSIS — M545 Low back pain: Secondary | ICD-10-CM | POA: Diagnosis not present

## 2015-12-27 DIAGNOSIS — M25562 Pain in left knee: Secondary | ICD-10-CM | POA: Diagnosis not present

## 2015-12-27 DIAGNOSIS — G894 Chronic pain syndrome: Secondary | ICD-10-CM | POA: Diagnosis not present

## 2015-12-27 DIAGNOSIS — Z79891 Long term (current) use of opiate analgesic: Secondary | ICD-10-CM | POA: Diagnosis not present

## 2016-01-23 DIAGNOSIS — J9801 Acute bronchospasm: Secondary | ICD-10-CM | POA: Diagnosis not present

## 2016-01-23 DIAGNOSIS — J218 Acute bronchiolitis due to other specified organisms: Secondary | ICD-10-CM | POA: Diagnosis not present

## 2016-01-25 DIAGNOSIS — Z79891 Long term (current) use of opiate analgesic: Secondary | ICD-10-CM | POA: Diagnosis not present

## 2016-01-25 DIAGNOSIS — M25562 Pain in left knee: Secondary | ICD-10-CM | POA: Diagnosis not present

## 2016-01-25 DIAGNOSIS — G890 Central pain syndrome: Secondary | ICD-10-CM | POA: Diagnosis not present

## 2016-01-25 DIAGNOSIS — G894 Chronic pain syndrome: Secondary | ICD-10-CM | POA: Diagnosis not present

## 2016-02-23 ENCOUNTER — Other Ambulatory Visit: Payer: Self-pay | Admitting: Family Medicine

## 2016-02-23 ENCOUNTER — Ambulatory Visit
Admission: RE | Admit: 2016-02-23 | Discharge: 2016-02-23 | Disposition: A | Discharge status: Home / Self Care | Payer: Medicare Other | Source: Ambulatory Visit | Attending: Family Medicine | Admitting: Family Medicine

## 2016-02-23 DIAGNOSIS — R05 Cough: Secondary | ICD-10-CM | POA: Diagnosis not present

## 2016-02-23 DIAGNOSIS — R062 Wheezing: Secondary | ICD-10-CM

## 2016-02-23 DIAGNOSIS — R053 Chronic cough: Secondary | ICD-10-CM

## 2016-02-27 DIAGNOSIS — J9801 Acute bronchospasm: Secondary | ICD-10-CM | POA: Diagnosis not present

## 2016-02-27 DIAGNOSIS — Z79891 Long term (current) use of opiate analgesic: Secondary | ICD-10-CM | POA: Diagnosis not present

## 2016-02-27 DIAGNOSIS — G541 Lumbosacral plexus disorders: Secondary | ICD-10-CM | POA: Diagnosis not present

## 2016-02-27 DIAGNOSIS — M25562 Pain in left knee: Secondary | ICD-10-CM | POA: Diagnosis not present

## 2016-02-27 DIAGNOSIS — M25551 Pain in right hip: Secondary | ICD-10-CM | POA: Diagnosis not present

## 2016-02-27 DIAGNOSIS — R05 Cough: Secondary | ICD-10-CM | POA: Diagnosis not present

## 2016-02-27 DIAGNOSIS — M5416 Radiculopathy, lumbar region: Secondary | ICD-10-CM | POA: Diagnosis not present

## 2016-03-14 DIAGNOSIS — F200 Paranoid schizophrenia: Secondary | ICD-10-CM | POA: Diagnosis not present

## 2016-03-14 DIAGNOSIS — K922 Gastrointestinal hemorrhage, unspecified: Secondary | ICD-10-CM | POA: Diagnosis not present

## 2016-03-14 DIAGNOSIS — M549 Dorsalgia, unspecified: Secondary | ICD-10-CM | POA: Diagnosis not present

## 2016-03-14 DIAGNOSIS — D649 Anemia, unspecified: Secondary | ICD-10-CM | POA: Diagnosis not present

## 2016-03-14 DIAGNOSIS — R52 Pain, unspecified: Secondary | ICD-10-CM | POA: Diagnosis not present

## 2016-03-14 DIAGNOSIS — R109 Unspecified abdominal pain: Secondary | ICD-10-CM | POA: Diagnosis not present

## 2016-03-14 DIAGNOSIS — R748 Abnormal levels of other serum enzymes: Secondary | ICD-10-CM | POA: Diagnosis not present

## 2016-03-14 DIAGNOSIS — I8501 Esophageal varices with bleeding: Secondary | ICD-10-CM | POA: Diagnosis not present

## 2016-03-14 DIAGNOSIS — D696 Thrombocytopenia, unspecified: Secondary | ICD-10-CM | POA: Diagnosis not present

## 2016-03-14 DIAGNOSIS — D62 Acute posthemorrhagic anemia: Secondary | ICD-10-CM | POA: Diagnosis not present

## 2016-03-14 DIAGNOSIS — K921 Melena: Secondary | ICD-10-CM | POA: Diagnosis not present

## 2016-03-14 DIAGNOSIS — I8500 Esophageal varices without bleeding: Secondary | ICD-10-CM | POA: Diagnosis not present

## 2016-03-14 DIAGNOSIS — K920 Hematemesis: Secondary | ICD-10-CM | POA: Diagnosis not present

## 2016-03-14 DIAGNOSIS — R740 Nonspecific elevation of levels of transaminase and lactic acid dehydrogenase [LDH]: Secondary | ICD-10-CM | POA: Diagnosis not present

## 2016-03-14 DIAGNOSIS — K7030 Alcoholic cirrhosis of liver without ascites: Secondary | ICD-10-CM | POA: Diagnosis not present

## 2016-03-14 DIAGNOSIS — R079 Chest pain, unspecified: Secondary | ICD-10-CM | POA: Diagnosis not present

## 2016-03-14 DIAGNOSIS — I8511 Secondary esophageal varices with bleeding: Secondary | ICD-10-CM | POA: Diagnosis not present

## 2016-03-14 DIAGNOSIS — G8929 Other chronic pain: Secondary | ICD-10-CM | POA: Diagnosis not present

## 2016-03-14 DIAGNOSIS — N390 Urinary tract infection, site not specified: Secondary | ICD-10-CM | POA: Diagnosis not present

## 2016-03-14 DIAGNOSIS — Z79899 Other long term (current) drug therapy: Secondary | ICD-10-CM | POA: Diagnosis not present

## 2016-03-14 DIAGNOSIS — M545 Low back pain: Secondary | ICD-10-CM | POA: Diagnosis not present

## 2016-03-22 DIAGNOSIS — K7460 Unspecified cirrhosis of liver: Secondary | ICD-10-CM | POA: Diagnosis not present

## 2016-03-22 DIAGNOSIS — K219 Gastro-esophageal reflux disease without esophagitis: Secondary | ICD-10-CM | POA: Diagnosis not present

## 2016-03-22 DIAGNOSIS — M545 Low back pain: Secondary | ICD-10-CM | POA: Diagnosis not present

## 2016-04-02 DIAGNOSIS — K7460 Unspecified cirrhosis of liver: Secondary | ICD-10-CM | POA: Diagnosis not present

## 2016-04-02 DIAGNOSIS — Z8719 Personal history of other diseases of the digestive system: Secondary | ICD-10-CM | POA: Diagnosis not present

## 2016-04-12 DIAGNOSIS — R0602 Shortness of breath: Secondary | ICD-10-CM | POA: Diagnosis not present

## 2016-04-12 DIAGNOSIS — R0600 Dyspnea, unspecified: Secondary | ICD-10-CM | POA: Diagnosis not present

## 2016-04-12 DIAGNOSIS — Z87891 Personal history of nicotine dependence: Secondary | ICD-10-CM | POA: Diagnosis not present

## 2016-04-12 DIAGNOSIS — R062 Wheezing: Secondary | ICD-10-CM | POA: Diagnosis not present

## 2016-04-22 DIAGNOSIS — J321 Chronic frontal sinusitis: Secondary | ICD-10-CM | POA: Diagnosis not present

## 2016-05-15 DIAGNOSIS — I8500 Esophageal varices without bleeding: Secondary | ICD-10-CM | POA: Diagnosis not present

## 2016-05-15 DIAGNOSIS — R12 Heartburn: Secondary | ICD-10-CM | POA: Diagnosis not present

## 2016-05-15 DIAGNOSIS — K7460 Unspecified cirrhosis of liver: Secondary | ICD-10-CM | POA: Diagnosis not present

## 2016-05-17 DIAGNOSIS — Z800 Family history of malignant neoplasm of digestive organs: Secondary | ICD-10-CM | POA: Diagnosis not present

## 2016-05-17 DIAGNOSIS — M549 Dorsalgia, unspecified: Secondary | ICD-10-CM | POA: Diagnosis not present

## 2016-05-17 DIAGNOSIS — K7460 Unspecified cirrhosis of liver: Secondary | ICD-10-CM | POA: Diagnosis not present

## 2016-05-17 DIAGNOSIS — J45909 Unspecified asthma, uncomplicated: Secondary | ICD-10-CM | POA: Diagnosis not present

## 2016-05-17 DIAGNOSIS — K219 Gastro-esophageal reflux disease without esophagitis: Secondary | ICD-10-CM | POA: Diagnosis not present

## 2016-05-17 DIAGNOSIS — I8500 Esophageal varices without bleeding: Secondary | ICD-10-CM | POA: Diagnosis not present

## 2016-05-17 DIAGNOSIS — I8510 Secondary esophageal varices without bleeding: Secondary | ICD-10-CM | POA: Diagnosis not present

## 2016-05-17 DIAGNOSIS — E785 Hyperlipidemia, unspecified: Secondary | ICD-10-CM | POA: Diagnosis not present

## 2016-05-27 DIAGNOSIS — M7989 Other specified soft tissue disorders: Secondary | ICD-10-CM | POA: Diagnosis not present

## 2016-06-17 DIAGNOSIS — M7989 Other specified soft tissue disorders: Secondary | ICD-10-CM | POA: Diagnosis not present

## 2016-07-16 DIAGNOSIS — R1032 Left lower quadrant pain: Secondary | ICD-10-CM | POA: Diagnosis not present

## 2016-07-30 DIAGNOSIS — R109 Unspecified abdominal pain: Secondary | ICD-10-CM | POA: Diagnosis not present

## 2016-10-10 DIAGNOSIS — K219 Gastro-esophageal reflux disease without esophagitis: Secondary | ICD-10-CM | POA: Diagnosis not present

## 2016-10-10 DIAGNOSIS — R109 Unspecified abdominal pain: Secondary | ICD-10-CM | POA: Diagnosis not present

## 2016-11-15 DIAGNOSIS — S7001XA Contusion of right hip, initial encounter: Secondary | ICD-10-CM | POA: Diagnosis not present

## 2016-11-15 DIAGNOSIS — Z87891 Personal history of nicotine dependence: Secondary | ICD-10-CM | POA: Diagnosis not present

## 2016-11-15 DIAGNOSIS — I10 Essential (primary) hypertension: Secondary | ICD-10-CM | POA: Diagnosis not present

## 2016-11-15 DIAGNOSIS — M549 Dorsalgia, unspecified: Secondary | ICD-10-CM | POA: Diagnosis not present

## 2016-11-15 DIAGNOSIS — G8929 Other chronic pain: Secondary | ICD-10-CM | POA: Diagnosis not present

## 2016-11-15 DIAGNOSIS — J45909 Unspecified asthma, uncomplicated: Secondary | ICD-10-CM | POA: Diagnosis not present

## 2016-11-15 DIAGNOSIS — T1490XA Injury, unspecified, initial encounter: Secondary | ICD-10-CM | POA: Diagnosis not present

## 2016-11-15 DIAGNOSIS — Z79899 Other long term (current) drug therapy: Secondary | ICD-10-CM | POA: Diagnosis not present

## 2016-11-15 DIAGNOSIS — Z79891 Long term (current) use of opiate analgesic: Secondary | ICD-10-CM | POA: Diagnosis not present

## 2016-11-25 DIAGNOSIS — S7001XA Contusion of right hip, initial encounter: Secondary | ICD-10-CM | POA: Diagnosis not present

## 2016-11-25 DIAGNOSIS — M545 Low back pain: Secondary | ICD-10-CM | POA: Diagnosis not present

## 2016-11-25 DIAGNOSIS — W19XXXA Unspecified fall, initial encounter: Secondary | ICD-10-CM | POA: Diagnosis not present

## 2016-12-05 DIAGNOSIS — R109 Unspecified abdominal pain: Secondary | ICD-10-CM | POA: Diagnosis not present

## 2016-12-05 DIAGNOSIS — Z886 Allergy status to analgesic agent status: Secondary | ICD-10-CM | POA: Diagnosis not present

## 2016-12-05 DIAGNOSIS — R0989 Other specified symptoms and signs involving the circulatory and respiratory systems: Secondary | ICD-10-CM | POA: Diagnosis not present

## 2016-12-05 DIAGNOSIS — I8500 Esophageal varices without bleeding: Secondary | ICD-10-CM | POA: Diagnosis not present

## 2016-12-05 DIAGNOSIS — I8510 Secondary esophageal varices without bleeding: Secondary | ICD-10-CM | POA: Diagnosis not present

## 2016-12-05 DIAGNOSIS — M1990 Unspecified osteoarthritis, unspecified site: Secondary | ICD-10-CM | POA: Diagnosis not present

## 2016-12-05 DIAGNOSIS — K219 Gastro-esophageal reflux disease without esophagitis: Secondary | ICD-10-CM | POA: Diagnosis not present

## 2016-12-05 DIAGNOSIS — J45909 Unspecified asthma, uncomplicated: Secondary | ICD-10-CM | POA: Diagnosis not present

## 2016-12-05 DIAGNOSIS — I10 Essential (primary) hypertension: Secondary | ICD-10-CM | POA: Diagnosis not present

## 2016-12-05 DIAGNOSIS — Z79899 Other long term (current) drug therapy: Secondary | ICD-10-CM | POA: Diagnosis not present

## 2016-12-05 DIAGNOSIS — Z801 Family history of malignant neoplasm of trachea, bronchus and lung: Secondary | ICD-10-CM | POA: Diagnosis not present

## 2016-12-05 DIAGNOSIS — E785 Hyperlipidemia, unspecified: Secondary | ICD-10-CM | POA: Diagnosis not present

## 2016-12-05 DIAGNOSIS — Z800 Family history of malignant neoplasm of digestive organs: Secondary | ICD-10-CM | POA: Diagnosis not present

## 2016-12-05 DIAGNOSIS — K7460 Unspecified cirrhosis of liver: Secondary | ICD-10-CM | POA: Diagnosis not present

## 2016-12-05 DIAGNOSIS — R200 Anesthesia of skin: Secondary | ICD-10-CM | POA: Diagnosis not present

## 2016-12-05 DIAGNOSIS — D649 Anemia, unspecified: Secondary | ICD-10-CM | POA: Diagnosis not present

## 2016-12-05 DIAGNOSIS — Z803 Family history of malignant neoplasm of breast: Secondary | ICD-10-CM | POA: Diagnosis not present

## 2016-12-05 DIAGNOSIS — M549 Dorsalgia, unspecified: Secondary | ICD-10-CM | POA: Diagnosis not present

## 2017-02-24 DIAGNOSIS — J219 Acute bronchiolitis, unspecified: Secondary | ICD-10-CM | POA: Diagnosis not present

## 2017-04-22 DIAGNOSIS — J441 Chronic obstructive pulmonary disease with (acute) exacerbation: Secondary | ICD-10-CM | POA: Diagnosis not present

## 2017-04-22 DIAGNOSIS — R05 Cough: Secondary | ICD-10-CM | POA: Diagnosis not present

## 2017-05-12 DIAGNOSIS — K625 Hemorrhage of anus and rectum: Secondary | ICD-10-CM | POA: Diagnosis not present

## 2017-06-04 DIAGNOSIS — K219 Gastro-esophageal reflux disease without esophagitis: Secondary | ICD-10-CM | POA: Diagnosis not present

## 2017-06-06 DIAGNOSIS — Z800 Family history of malignant neoplasm of digestive organs: Secondary | ICD-10-CM | POA: Diagnosis not present

## 2017-06-06 DIAGNOSIS — K7460 Unspecified cirrhosis of liver: Secondary | ICD-10-CM | POA: Diagnosis not present

## 2017-06-06 DIAGNOSIS — J45909 Unspecified asthma, uncomplicated: Secondary | ICD-10-CM | POA: Diagnosis not present

## 2017-06-06 DIAGNOSIS — Z79899 Other long term (current) drug therapy: Secondary | ICD-10-CM | POA: Diagnosis not present

## 2017-06-06 DIAGNOSIS — Z8042 Family history of malignant neoplasm of prostate: Secondary | ICD-10-CM | POA: Diagnosis not present

## 2017-06-06 DIAGNOSIS — I8500 Esophageal varices without bleeding: Secondary | ICD-10-CM | POA: Diagnosis not present

## 2017-06-06 DIAGNOSIS — M549 Dorsalgia, unspecified: Secondary | ICD-10-CM | POA: Diagnosis not present

## 2017-06-06 DIAGNOSIS — I8510 Secondary esophageal varices without bleeding: Secondary | ICD-10-CM | POA: Diagnosis not present

## 2017-06-06 DIAGNOSIS — Z801 Family history of malignant neoplasm of trachea, bronchus and lung: Secondary | ICD-10-CM | POA: Diagnosis not present

## 2017-06-06 DIAGNOSIS — J40 Bronchitis, not specified as acute or chronic: Secondary | ICD-10-CM | POA: Diagnosis not present

## 2017-06-06 DIAGNOSIS — M1990 Unspecified osteoarthritis, unspecified site: Secondary | ICD-10-CM | POA: Diagnosis not present

## 2017-06-06 DIAGNOSIS — Z803 Family history of malignant neoplasm of breast: Secondary | ICD-10-CM | POA: Diagnosis not present

## 2017-06-06 DIAGNOSIS — K219 Gastro-esophageal reflux disease without esophagitis: Secondary | ICD-10-CM | POA: Diagnosis not present

## 2017-06-27 DIAGNOSIS — M545 Low back pain: Secondary | ICD-10-CM | POA: Diagnosis not present

## 2017-07-04 DIAGNOSIS — M79662 Pain in left lower leg: Secondary | ICD-10-CM | POA: Diagnosis not present

## 2017-07-04 DIAGNOSIS — M79605 Pain in left leg: Secondary | ICD-10-CM | POA: Diagnosis not present

## 2017-07-04 DIAGNOSIS — M7989 Other specified soft tissue disorders: Secondary | ICD-10-CM | POA: Diagnosis not present

## 2017-07-17 DIAGNOSIS — M5136 Other intervertebral disc degeneration, lumbar region: Secondary | ICD-10-CM | POA: Diagnosis not present

## 2017-07-17 DIAGNOSIS — K4090 Unilateral inguinal hernia, without obstruction or gangrene, not specified as recurrent: Secondary | ICD-10-CM | POA: Diagnosis not present

## 2017-07-17 DIAGNOSIS — R1909 Other intra-abdominal and pelvic swelling, mass and lump: Secondary | ICD-10-CM | POA: Diagnosis not present

## 2017-07-17 DIAGNOSIS — R350 Frequency of micturition: Secondary | ICD-10-CM | POA: Diagnosis not present

## 2017-07-19 IMAGING — CR DG CHEST 2V
2 series · 2 of 2 positions shown · non-contrast
Comparison: 02/20/2012

CLINICAL DATA: Cough, wheezing for 2 months.

EXAM:
CHEST  2 VIEW

[w chest pa]
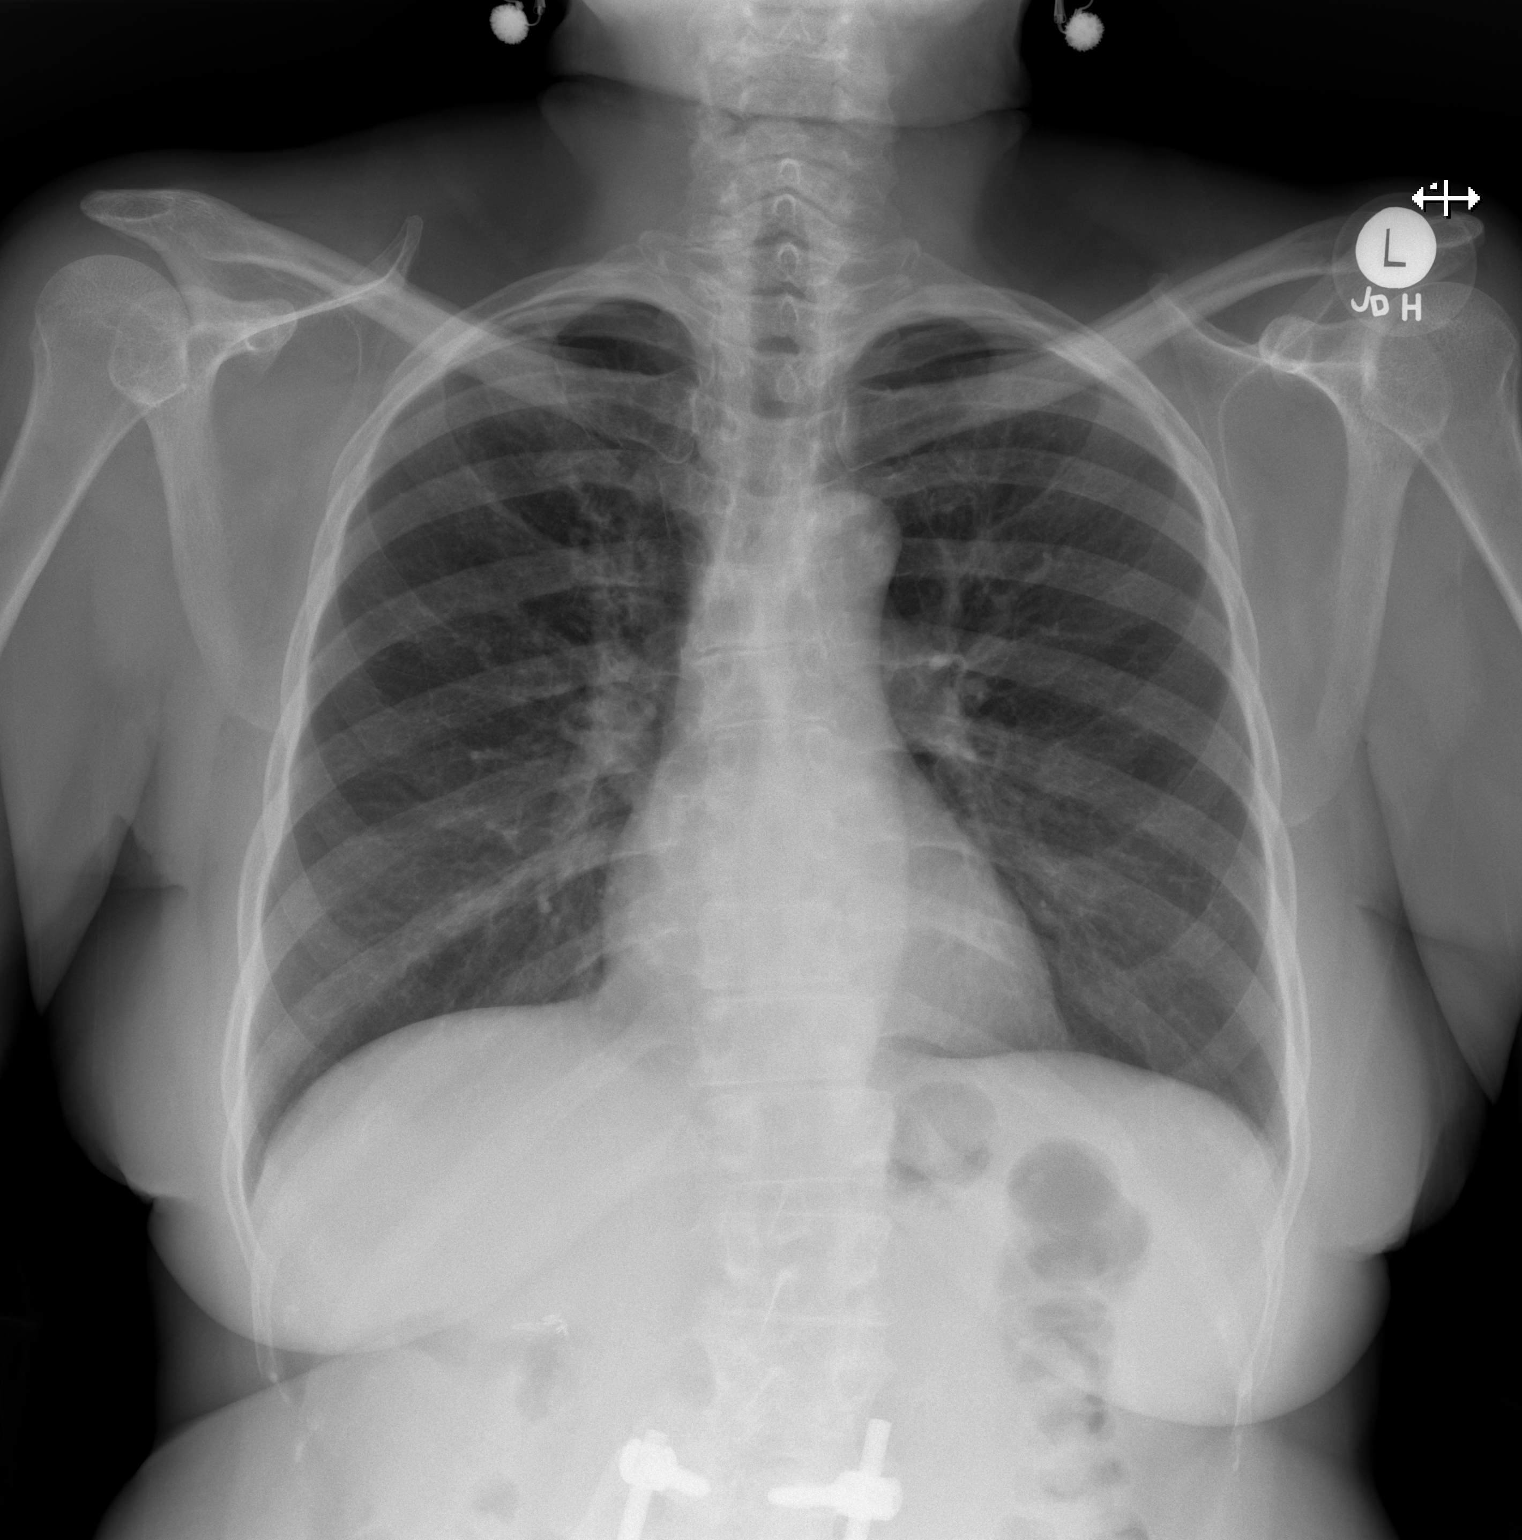

[w chest lat]
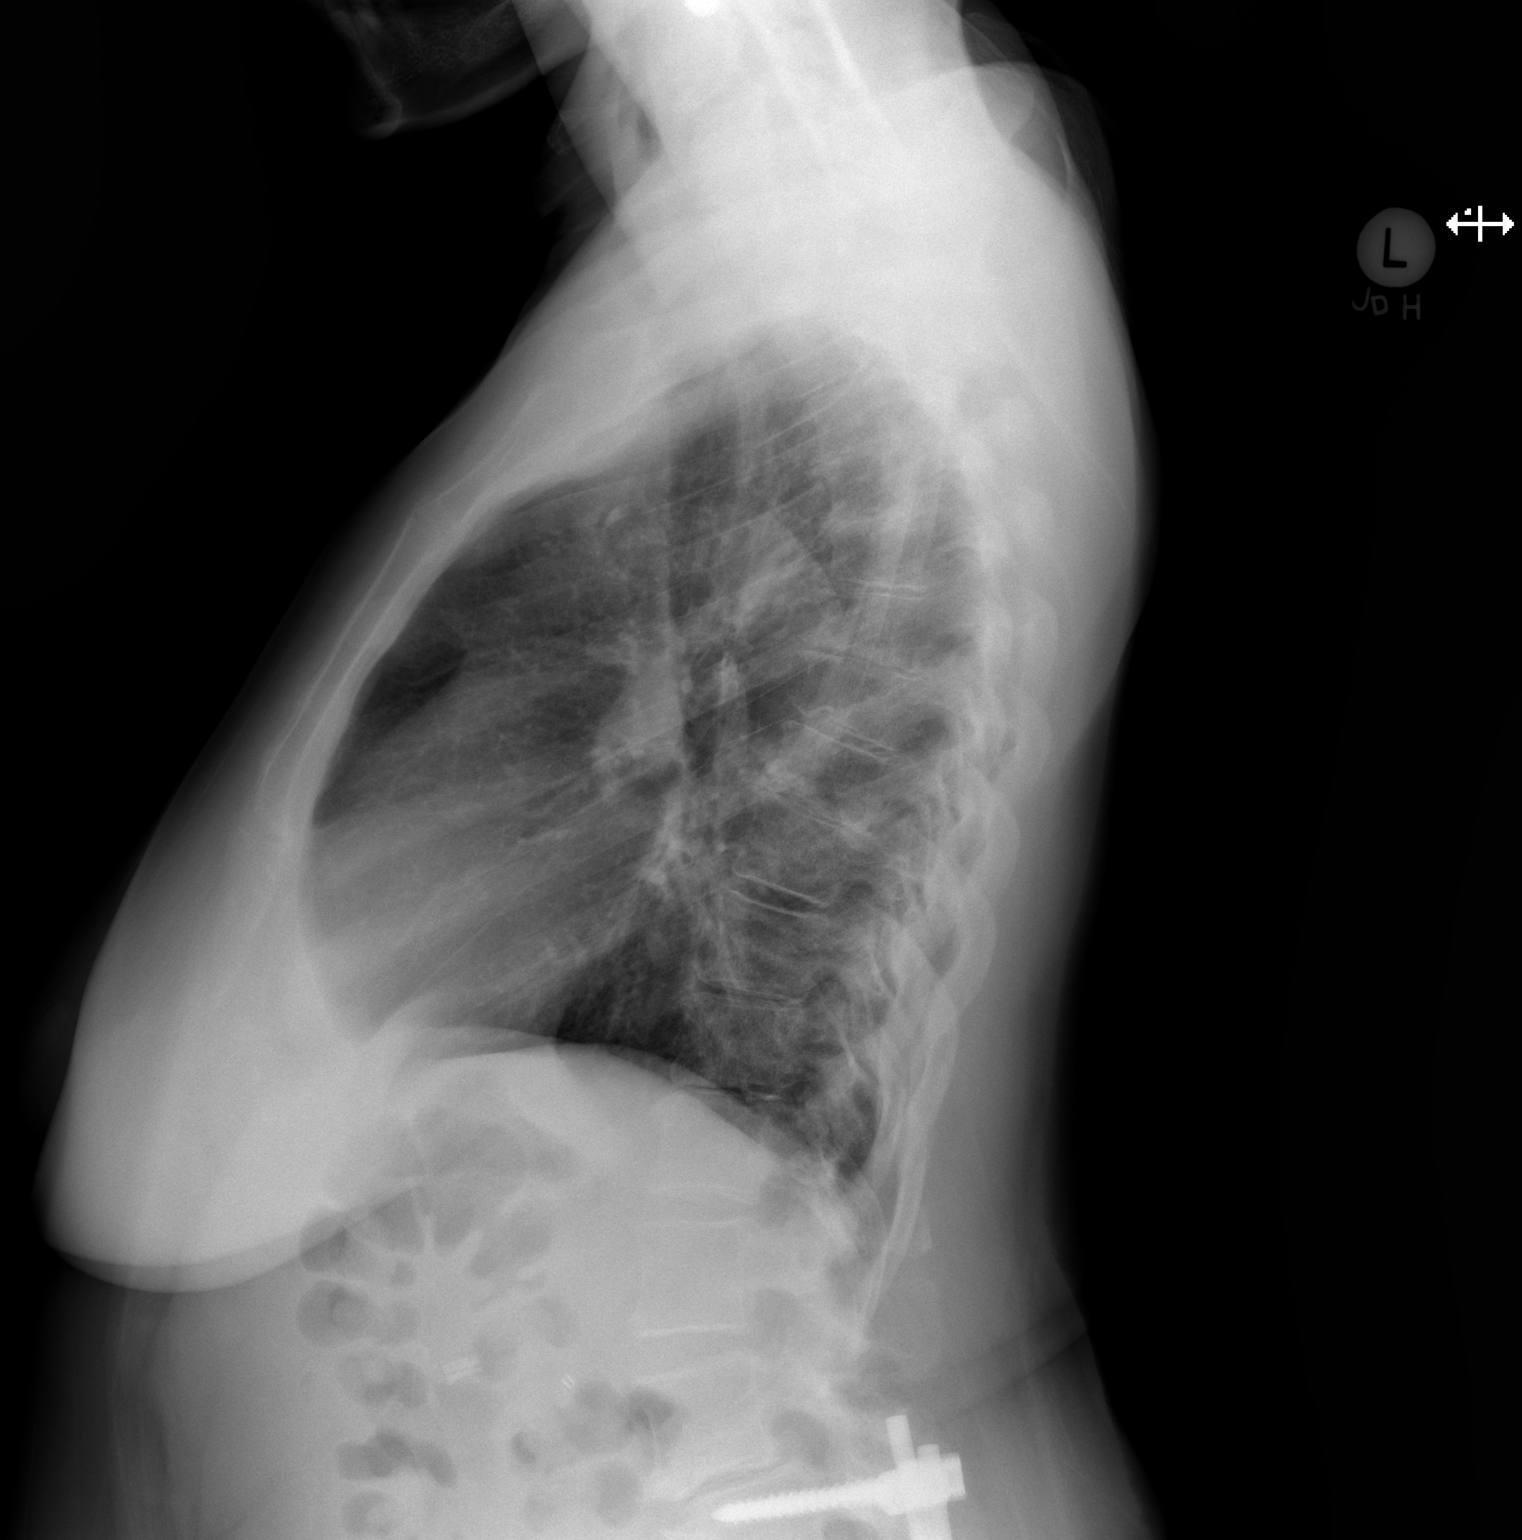

[2 of 2 positions shown; findings below may reference images not displayed]

FINDINGS: The heart size and mediastinal contours are within normal limits.
Both lungs are clear. The visualized skeletal structures are
unremarkable.
IMPRESSION: No active cardiopulmonary disease.

## 2017-08-28 DIAGNOSIS — K429 Umbilical hernia without obstruction or gangrene: Secondary | ICD-10-CM | POA: Diagnosis not present

## 2017-08-28 DIAGNOSIS — K4090 Unilateral inguinal hernia, without obstruction or gangrene, not specified as recurrent: Secondary | ICD-10-CM | POA: Diagnosis not present

## 2017-09-01 DIAGNOSIS — K429 Umbilical hernia without obstruction or gangrene: Secondary | ICD-10-CM | POA: Diagnosis not present

## 2017-09-01 DIAGNOSIS — K4090 Unilateral inguinal hernia, without obstruction or gangrene, not specified as recurrent: Secondary | ICD-10-CM | POA: Diagnosis not present

## 2017-09-03 DIAGNOSIS — K429 Umbilical hernia without obstruction or gangrene: Secondary | ICD-10-CM | POA: Diagnosis not present

## 2017-09-03 DIAGNOSIS — K4090 Unilateral inguinal hernia, without obstruction or gangrene, not specified as recurrent: Secondary | ICD-10-CM | POA: Diagnosis not present

## 2017-09-24 DIAGNOSIS — H547 Unspecified visual loss: Secondary | ICD-10-CM | POA: Diagnosis not present

## 2017-09-24 DIAGNOSIS — K219 Gastro-esophageal reflux disease without esophagitis: Secondary | ICD-10-CM | POA: Diagnosis not present

## 2017-09-24 DIAGNOSIS — G8929 Other chronic pain: Secondary | ICD-10-CM | POA: Diagnosis not present

## 2017-09-24 DIAGNOSIS — M5136 Other intervertebral disc degeneration, lumbar region: Secondary | ICD-10-CM | POA: Diagnosis not present

## 2017-09-24 DIAGNOSIS — Z13220 Encounter for screening for lipoid disorders: Secondary | ICD-10-CM | POA: Diagnosis not present

## 2017-09-24 DIAGNOSIS — I10 Essential (primary) hypertension: Secondary | ICD-10-CM | POA: Diagnosis not present

## 2017-11-21 DIAGNOSIS — Z9889 Other specified postprocedural states: Secondary | ICD-10-CM | POA: Diagnosis not present

## 2017-11-21 DIAGNOSIS — M25473 Effusion, unspecified ankle: Secondary | ICD-10-CM | POA: Diagnosis not present

## 2017-12-15 DIAGNOSIS — Z1231 Encounter for screening mammogram for malignant neoplasm of breast: Secondary | ICD-10-CM | POA: Diagnosis not present

## 2018-01-02 DIAGNOSIS — R920 Mammographic microcalcification found on diagnostic imaging of breast: Secondary | ICD-10-CM | POA: Diagnosis not present

## 2018-01-02 DIAGNOSIS — R928 Other abnormal and inconclusive findings on diagnostic imaging of breast: Secondary | ICD-10-CM | POA: Diagnosis not present

## 2018-01-20 DIAGNOSIS — R0602 Shortness of breath: Secondary | ICD-10-CM | POA: Diagnosis not present

## 2018-01-20 DIAGNOSIS — M7989 Other specified soft tissue disorders: Secondary | ICD-10-CM | POA: Diagnosis not present

## 2018-01-20 DIAGNOSIS — K7460 Unspecified cirrhosis of liver: Secondary | ICD-10-CM | POA: Diagnosis not present

## 2018-01-20 DIAGNOSIS — R609 Edema, unspecified: Secondary | ICD-10-CM | POA: Diagnosis not present

## 2018-02-05 DIAGNOSIS — K7460 Unspecified cirrhosis of liver: Secondary | ICD-10-CM | POA: Diagnosis not present

## 2018-02-05 DIAGNOSIS — R1033 Periumbilical pain: Secondary | ICD-10-CM | POA: Diagnosis not present

## 2018-02-05 DIAGNOSIS — I8500 Esophageal varices without bleeding: Secondary | ICD-10-CM | POA: Diagnosis not present

## 2019-11-12 ENCOUNTER — Encounter (HOSPITAL_COMMUNITY): Payer: Self-pay | Admitting: Emergency Medicine

## 2019-11-12 ENCOUNTER — Emergency Department (HOSPITAL_COMMUNITY): Payer: Medicare Other

## 2019-11-12 ENCOUNTER — Other Ambulatory Visit: Payer: Self-pay

## 2019-11-12 ENCOUNTER — Emergency Department (HOSPITAL_COMMUNITY)
Admission: EM | Admit: 2019-11-12 | Discharge: 2019-11-13 | Disposition: A | Discharge status: Home / Self Care | Payer: Medicare Other | Attending: Emergency Medicine | Admitting: Emergency Medicine

## 2019-11-12 DIAGNOSIS — R509 Fever, unspecified: Secondary | ICD-10-CM | POA: Insufficient documentation

## 2019-11-12 DIAGNOSIS — R0602 Shortness of breath: Secondary | ICD-10-CM

## 2019-11-12 DIAGNOSIS — Z20822 Contact with and (suspected) exposure to covid-19: Secondary | ICD-10-CM | POA: Insufficient documentation

## 2019-11-12 DIAGNOSIS — J45901 Unspecified asthma with (acute) exacerbation: Secondary | ICD-10-CM | POA: Diagnosis not present

## 2019-11-12 LAB — COMPREHENSIVE METABOLIC PANEL
ALT: 29 U/L (ref 0–44)
AST: 58 U/L — ABNORMAL HIGH (ref 15–41)
Albumin: 2.8 g/dL — ABNORMAL LOW (ref 3.5–5.0)
Alkaline Phosphatase: 127 U/L — ABNORMAL HIGH (ref 38–126)
Anion gap: 10 (ref 5–15)
BUN: 10 mg/dL (ref 8–23)
CO2: 30 mmol/L (ref 22–32)
Calcium: 8.6 mg/dL — ABNORMAL LOW (ref 8.9–10.3)
Chloride: 97 mmol/L — ABNORMAL LOW (ref 98–111)
Creatinine, Ser: 0.61 mg/dL (ref 0.44–1.00)
GFR calc Af Amer: >60 mL/min (ref >60)
GFR calc non Af Amer: >60 mL/min (ref >60)
Glucose, Bld: 104 mg/dL — ABNORMAL HIGH (ref 70–99)
Potassium: 3.6 mmol/L (ref 3.5–5.1)
Sodium: 137 mmol/L (ref 135–145)
Total Bilirubin: 2.2 mg/dL — ABNORMAL HIGH (ref 0.3–1.2)
Total Protein: 6.6 g/dL (ref 6.5–8.1)

## 2019-11-12 LAB — LIPASE, BLOOD: Lipase: 35 U/L (ref 11–51)

## 2019-11-12 LAB — CBC WITH DIFFERENTIAL/PLATELET
Abs Immature Granulocytes: 0.01 10*3/uL (ref 0.00–0.07)
Basophils Absolute: 0 10*3/uL (ref 0.0–0.1)
Basophils Relative: 1 %
Eosinophils Absolute: 0.2 10*3/uL (ref 0.0–0.5)
Eosinophils Relative: 7 %
HCT: 41.4 % (ref 36.0–46.0)
Hemoglobin: 14.1 g/dL (ref 12.0–15.0)
Immature Granulocytes: 0 %
Lymphocytes Relative: 24 %
Lymphs Abs: 0.7 10*3/uL (ref 0.7–4.0)
MCH: 33.9 pg (ref 26.0–34.0)
MCHC: 34.1 g/dL (ref 30.0–36.0)
MCV: 99.5 fL (ref 80.0–100.0)
Monocytes Absolute: 0.3 10*3/uL (ref 0.1–1.0)
Monocytes Relative: 12 %
Neutro Abs: 1.5 10*3/uL — ABNORMAL LOW (ref 1.7–7.7)
Neutrophils Relative %: 56 %
Platelets: 72 10*3/uL — ABNORMAL LOW (ref 150–400)
RBC: 4.16 MIL/uL (ref 3.87–5.11)
RDW: 14 % (ref 11.5–15.5)
WBC: 2.7 10*3/uL — ABNORMAL LOW (ref 4.0–10.5)
nRBC: 0 % (ref 0.0–0.2)

## 2019-11-12 LAB — LACTIC ACID, PLASMA
Lactic Acid, Venous: 2 mmol/L (ref 0.5–1.9)
Lactic Acid, Venous: 1.8 mmol/L (ref 0.5–1.9)

## 2019-11-12 LAB — SARS CORONAVIRUS 2 (TAT 6-24 HRS): SARS Coronavirus 2: NEGATIVE

## 2019-11-12 LAB — TROPONIN I (HIGH SENSITIVITY)
Troponin I (High Sensitivity): 3 ng/L (ref <18)
Troponin I (High Sensitivity): 3 ng/L (ref <18)

## 2019-11-12 LAB — BRAIN NATRIURETIC PEPTIDE: B Natriuretic Peptide: 153.1 pg/mL — ABNORMAL HIGH (ref 0.0–100.0)

## 2019-11-12 MED ORDER — PREDNISONE 50 MG PO TABS: 50.0000 mg | ORAL_TABLET | Freq: Every day | ORAL | 0 refills | Status: AC

## 2019-11-12 MED ORDER — IPRATROPIUM BROMIDE HFA 17 MCG/ACT IN AERS
2.0000 | INHALATION_SPRAY | Freq: Once | RESPIRATORY_TRACT | Status: AC
  Administered 2019-11-12: 17:00:00 2 via RESPIRATORY_TRACT
  Filled 2019-11-12: qty 12.9

## 2019-11-12 MED ORDER — ALBUTEROL SULFATE HFA 108 (90 BASE) MCG/ACT IN AERS
2.0000 | INHALATION_SPRAY | Freq: Once | RESPIRATORY_TRACT | Status: AC
  Administered 2019-11-12: 17:00:00 2 via RESPIRATORY_TRACT
  Filled 2019-11-12: qty 6.7

## 2019-11-12 MED ORDER — OXYCODONE HCL 5 MG PO TABS
10.0000 mg | ORAL_TABLET | Freq: Once | ORAL | Status: AC
  Administered 2019-11-12: 10 mg via ORAL
  Filled 2019-11-12: qty 2

## 2019-11-12 NOTE — ED Notes (Signed)
Stuck patient for lab work, unsuccessful.

## 2019-11-12 NOTE — ED Notes (Signed)
Phlebotomy called for collection of repeat troponin

## 2019-11-12 NOTE — ED Provider Notes (Signed)
[facility] DEPT Provider Note   CSN: [id number] Arrival date & time: 11/12/19  1615     History Chief Complaint  Patient presents with  . Asthma  . Shortness of Breath    Amanda Monroe is a 69 y.o. female.  The history is provided by the patient, the EMS personnel and medical records (ems report to nursing). No language interpreter was used.  Shortness of Breath Severity:  Moderate Onset quality:  Gradual Duration:  2 days Timing:  Constant Progression:  Improving Chronicity:  Recurrent Context: pollens and weather changes   Relieved by:  Nothing Worsened by:  Nothing Ineffective treatments:  Inhaler Associated symptoms: chest pain (tightness), cough, fever, sputum production and wheezing   Associated symptoms: no abdominal pain, no diaphoresis, no headaches, no hemoptysis, no neck pain and no vomiting        Past Medical History:  Diagnosis Date  . Depression     There are no problems to display for this patient.   Past Surgical History:  Procedure Laterality Date  . BACK SURGERY    . KNEE SURGERY       OB History   No obstetric history on file.     No family history on file.  Social History   Tobacco Use  . Smoking status: Never Smoker  . Smokeless tobacco: Never Used  Substance Use Topics  . Alcohol use: No    Comment: drinks occasionally  . Drug use: Not on file    Home Medications Prior to Admission medications   Medication Sig Start Date End Date Taking? Authorizing Provider  oxyCODONE (OXYCONTIN) 10 MG 12 hr tablet Take 10 mg by mouth every 12 (twelve) hours.    [provider]  temazepam (RESTORIL) 30 MG capsule Take 30 mg by mouth at bedtime as needed. For sleep    [provider]    Allergies    Aspirin  Review of Systems   Review of Systems  Constitutional: Positive for chills, fatigue and fever. Negative for diaphoresis.  HENT: Positive for congestion.   Eyes: Negative for  visual disturbance.  Respiratory: Positive for cough, sputum production, chest tightness, shortness of breath and wheezing. Negative for hemoptysis and stridor.   Cardiovascular: Positive for chest pain (tightness). Negative for palpitations and leg swelling.  Gastrointestinal: Positive for nausea. Negative for abdominal pain, constipation, diarrhea and vomiting.  Genitourinary: Negative for flank pain.  Musculoskeletal: Negative for back pain, neck pain and neck stiffness.  Neurological: Negative for dizziness, weakness, light-headedness and headaches.  Psychiatric/Behavioral: Negative for agitation.  All other systems reviewed and are negative.   Physical Exam Updated Vital Signs BP 124/80 (BP Location: Left Arm)   Pulse 84   Temp 98.2 F (36.8 C)   Resp 19   SpO2 99%   Physical Exam Vitals and nursing note reviewed.  Constitutional:      General: She is not in acute distress.    Appearance: She is well-developed. She is not ill-appearing, toxic-appearing or diaphoretic.  HENT:     Head: Normocephalic and atraumatic.     Right Ear: External ear normal.     Left Ear: External ear normal.     Nose: Nose normal.     Mouth/Throat:     Mouth: Mucous membranes are moist.     Pharynx: Oropharynx is clear. No oropharyngeal exudate.  Eyes:     Conjunctiva/sclera: Conjunctivae normal.     Pupils: Pupils are equal, round, and reactive to light.  Cardiovascular:     Rate and Rhythm: Normal rate and regular rhythm.  Pulmonary:     Effort: Tachypnea present. No respiratory distress.     Breath sounds: No stridor. Wheezing and rhonchi present.  Chest:     Chest wall: Tenderness present.  Abdominal:     General: There is no distension.     Tenderness: There is no abdominal tenderness. There is no rebound.  Musculoskeletal:     Cervical back: Normal range of motion and neck supple.     Right lower leg: No tenderness. No edema.     Left lower leg: No tenderness. No edema.  Skin:     General: Skin is warm.     Capillary Refill: Capillary refill takes less than 2 seconds.     Coloration: Skin is not pale.     Findings: No erythema or rash.  Neurological:     General: No focal deficit present.     Mental Status: She is alert and oriented to person, place, and time.     Motor: No abnormal muscle tone.     Coordination: Coordination normal.     Deep Tendon Reflexes: Reflexes are normal and symmetric.     ED Results / Procedures / Treatments   Labs (all labs ordered are listed, but only abnormal results are displayed) Labs Reviewed  CBC WITH DIFFERENTIAL/PLATELET - Abnormal; Notable for the following components:      Result Value   WBC 2.7 (*)    Platelets 72 (*)    Neutro Abs 1.5 (*)    All other components within normal limits  COMPREHENSIVE METABOLIC PANEL - Abnormal; Notable for the following components:   Chloride 97 (*)    Glucose, Bld 104 (*)    Calcium 8.6 (*)    Albumin 2.8 (*)    AST 58 (*)    Alkaline Phosphatase 127 (*)    Total Bilirubin 2.2 (*)    All other components within normal limits  BRAIN NATRIURETIC PEPTIDE - Abnormal; Notable for the following components:   B Natriuretic Peptide 153.1 (*)    All other components within normal limits  LACTIC ACID, PLASMA - Abnormal; Notable for the following components:   Lactic Acid, Venous 2.0 (*)    All other components within normal limits  SARS CORONAVIRUS 2 (TAT 6-24 HRS)  LACTIC ACID, PLASMA  LIPASE, BLOOD  TROPONIN I (HIGH SENSITIVITY)  TROPONIN I (HIGH SENSITIVITY)    EKG EKG Interpretation  Date/Time:  Friday November 12 2019 17:28:20 EDT Ventricular Rate:  81 PR Interval:    QRS Duration: 76 QT Interval:  395 QTC Calculation: 459 R Axis:   60 Text Interpretation: Sinus rhythm Atrial premature complex Abnormal R-wave progression, early transition When compared to prior, no significant changes seen. No sTEMI Confirmed by Theda Belfast ([id number]) on 11/12/2019 5:42:11 PM   Radiology DG  Chest Portable 1 View  Result Date: 11/12/2019 CLINICAL DATA:  Cough and shortness of breath EXAM: PORTABLE CHEST 1 VIEW COMPARISON:  02/23/2016 FINDINGS: The heart size and mediastinal contours are within normal limits. Both lungs are clear. The visualized skeletal structures are unremarkable. IMPRESSION: No active disease. Electronically Signed   By: Alcide Clever M.D.   On: 11/12/2019 17:43    Procedures Procedures (including critical care time)  Medications Ordered in ED Medications  albuterol (VENTOLIN HFA) 108 (90 Base) MCG/ACT inhaler 2 puff (2 puffs Inhalation Given 11/12/19 1721)  ipratropium (ATROVENT HFA) inhaler 2 puff (2 puffs Inhalation Given 11/12/19  1721)  oxyCODONE (Oxy IR/ROXICODONE) immediate release tablet 10 mg (10 mg Oral Given 11/12/19 2126)    ED Course  I have reviewed the triage vital signs and the nursing notes.  Pertinent labs & imaging results that were available during my care of the patient were reviewed by me and considered in my medical decision making (see chart for details).    MDM Rules/Calculators/A&P                      CARINE NORDGREN is a 69 y.o. female with a past medical history significant for depression and asthma who presents with chills, productive cough, shortness of breath, chest tightness, and fatigue.  Patient reports that several weeks ago she was seen in [place] where she had a work-up for similar symptoms.  At that time, she was told she had an asthma exacerbation as she had a reportedly negative Covid test, x-rays not show pneumonia, and she had a negative thromboembolic work-up for clots.  Patient says she has had similar symptoms for the last few days with more shortness of breath and cough.  She denies any sick contacts but EMS reportedly found her to have an oral temperature of 100.6.  She is coughing up phlegm and is having significant wheezing but is now fine to her home inhalers.  She could not breathe today and called for help.   Patient arrived on a nonrebreather but was given Solu-Medrol and albuterol in route.  After evaluation, patient was removed from the nonrebreather and is now on room air and feeling much better.  She denies any leg pain or leg swelling.  She denies any recent trauma.  She denies any current nausea with vomiting, constipation, diarrhea, or urinary symptoms.  On exam, patient does have wheezing in all lung fields.  There is also some faint rhonchi.  No significant rales.  Abdomen is nontender but chest is tender and reproduces the chest tightness she is describing.  It does not radiate.  She denies leg pain or leg swelling.  Had a shared decision conversation with patient and we agreed to do a work-up.  We agreed to hold on a thromboembolic work-up given the reported recently negative ultrasounds and blood clot work-up.  We will have her get chest x-ray, labs, and treat her with more albuterol and Atrovent.  If work-up is reassuring, anticipate discharge home with prescription for steroid burst given the asthma exacerbation.  Patient does say that the pollen and temperature fluctuations currently experiencing this community are setting of her breathing problems.  11:42 PM On reassessment, patient is still feeling much better.  She was given her home dose of pain medicine which felt better for her back.  She is maintaining oxygen saturations over 96% on room air and was able to drink normally.  Her wheezing has drastically improved during her ED stay.  Suspect asthma exacerbation in the setting of the pollens and temperature changes.  Patient would like to go home.  She was given a prescription for steroid burst and will follow up with her PCP.  She has inhalers at home.  She understands return precautions and was discharged in good condition.   Final Clinical Impression(s) / ED Diagnoses Final diagnoses:  Exacerbation of asthma, unspecified asthma severity, unspecified whether persistent  Shortness of  breath    Rx / DC Orders ED Discharge Orders         Ordered    predniSONE (DELTASONE) 50 MG tablet  Daily     11/12/19 2338          Clinical Impression: 1. Exacerbation of asthma, unspecified asthma severity, unspecified whether persistent   2. Shortness of breath     Disposition: Discharge  Condition: Good  I have discussed the results, Dx and Tx plan with the pt(& family if present). He/she/they expressed understanding and agree(s) with the plan. Discharge instructions discussed at great length. Strict return precautions discussed and pt &/or family have verbalized understanding of the instructions. No further questions at time of discharge.    New Prescriptions   PREDNISONE (DELTASONE) 50 MG TABLET    Take 1 tablet (50 mg total) by mouth daily.    Follow Up: [facility] [address] [place] [address] [phone] Schedule an appointment as soon as possible for a visit    [facility] [address] [id number] mc [place] [address] [phone]       Maahir Horst, Canary Brim, MD 11/12/19 [phone]

## 2019-11-12 NOTE — ED Triage Notes (Signed)
Per [facility] pt from home for SOB that gotten worse and not relieved with nebulizer. Hx asthma. Pt has diminished sounds in all lung fields unnoted before neb admiinistered via EMS. Has wheezing after neb treatment in route. Pt tachypnea per EMS.  Pt comes in on NRB-O2  22g in right forearm Solu-Medrol 125mg . Albuterol 10mg  total given in route.  117/84, 85HR, 100.6 temp.

## 2019-11-12 NOTE — Discharge Instructions (Signed)
Your work-up today was consistent with an asthma exacerbation with your wheezing causing the shortness of breath and discomfort.  We had a shared decision-making, patient together and agreed to hold on a blood clot work-up as you just had a negative work-up for this.  Given the improvement in your breathing during her ED stay after the steroids and breathing treatments, we feel you are safe for discharge home.  Please take the steroids for the next 4 days starting tomorrow as you already received tonight's dose with EMS.  Please rest and stay hydrated.  If any symptoms change or worsen, please return to the nearest emergency department immediately.

## 2019-11-12 NOTE — ED Notes (Signed)
Date and time results received: 11/12/19 6:39 PM  (use smartphrase ".now" to insert current time)  Test: lactic acid Critical Value: 2.0  Name of Provider Notified: Tegeler EDP  Orders Received? Or Actions Taken?:

## 2019-11-12 NOTE — ED Notes (Signed)
Failed attempt to collect repeat Troponin

## 2020-05-29 ENCOUNTER — Emergency Department (HOSPITAL_COMMUNITY): Payer: Medicare Other

## 2020-05-29 ENCOUNTER — Encounter (HOSPITAL_COMMUNITY): Payer: Self-pay

## 2020-05-29 ENCOUNTER — Other Ambulatory Visit: Payer: Self-pay

## 2020-05-29 ENCOUNTER — Emergency Department (HOSPITAL_COMMUNITY)
Admission: EM | Admit: 2020-05-29 | Discharge: 2020-05-30 | Disposition: A | Discharge status: Home / Self Care | Payer: Medicare Other | Attending: Emergency Medicine | Admitting: Emergency Medicine

## 2020-05-29 DIAGNOSIS — R1012 Left upper quadrant pain: Secondary | ICD-10-CM | POA: Diagnosis not present

## 2020-05-29 DIAGNOSIS — R109 Unspecified abdominal pain: Secondary | ICD-10-CM

## 2020-05-29 DIAGNOSIS — R1031 Right lower quadrant pain: Secondary | ICD-10-CM | POA: Diagnosis present

## 2020-05-29 HISTORY — DX: Unspecified viral hepatitis C without hepatic coma: B19.20

## 2020-05-29 HISTORY — DX: Unspecified cirrhosis of liver: K74.60

## 2020-05-29 LAB — BASIC METABOLIC PANEL
Anion gap: 10 (ref 5–15)
BUN: 17 mg/dL (ref 8–23)
CO2: 30 mmol/L (ref 22–32)
Calcium: 8.8 mg/dL — ABNORMAL LOW (ref 8.9–10.3)
Chloride: 97 mmol/L — ABNORMAL LOW (ref 98–111)
Creatinine, Ser: 1.06 mg/dL — ABNORMAL HIGH (ref 0.44–1.00)
GFR, Estimated: 57 mL/min — ABNORMAL LOW (ref >60)
Glucose, Bld: 82 mg/dL (ref 70–99)
Potassium: 3.3 mmol/L — ABNORMAL LOW (ref 3.5–5.1)
Sodium: 137 mmol/L (ref 135–145)

## 2020-05-29 LAB — CBC
HCT: 36.8 % (ref 36.0–46.0)
Hemoglobin: 12.6 g/dL (ref 12.0–15.0)
MCH: 34.2 pg — ABNORMAL HIGH (ref 26.0–34.0)
MCHC: 34.2 g/dL (ref 30.0–36.0)
MCV: 100 fL (ref 80.0–100.0)
Platelets: 74 10*3/uL — ABNORMAL LOW (ref 150–400)
RBC: 3.68 MIL/uL — ABNORMAL LOW (ref 3.87–5.11)
RDW: 12.9 % (ref 11.5–15.5)
WBC: 3.9 10*3/uL — ABNORMAL LOW (ref 4.0–10.5)
nRBC: 0 % (ref 0.0–0.2)

## 2020-05-29 LAB — TROPONIN I (HIGH SENSITIVITY)
Troponin I (High Sensitivity): 5 ng/L (ref <18)
Troponin I (High Sensitivity): 5 ng/L (ref <18)

## 2020-05-29 NOTE — ED Triage Notes (Signed)
Pt presents with Right side abd pain and Left side CP x1 week.

## 2020-05-30 ENCOUNTER — Encounter (HOSPITAL_COMMUNITY): Payer: Self-pay

## 2020-05-30 ENCOUNTER — Emergency Department (HOSPITAL_COMMUNITY): Payer: Medicare Other

## 2020-05-30 DIAGNOSIS — R1012 Left upper quadrant pain: Secondary | ICD-10-CM | POA: Diagnosis not present

## 2020-05-30 LAB — URINALYSIS, ROUTINE W REFLEX MICROSCOPIC
Bilirubin Urine: NEGATIVE
Glucose, UA: NEGATIVE mg/dL
Hgb urine dipstick: NEGATIVE
Ketones, ur: NEGATIVE mg/dL
Leukocytes,Ua: NEGATIVE
Nitrite: NEGATIVE
Protein, ur: NEGATIVE mg/dL
Specific Gravity, Urine: 1.017 (ref 1.005–1.030)
pH: 7 (ref 5.0–8.0)

## 2020-05-30 LAB — HEPATIC FUNCTION PANEL
ALT: 23 U/L (ref 0–44)
AST: 54 U/L — ABNORMAL HIGH (ref 15–41)
Albumin: 2.8 g/dL — ABNORMAL LOW (ref 3.5–5.0)
Alkaline Phosphatase: 150 U/L — ABNORMAL HIGH (ref 38–126)
Bilirubin, Direct: 0.6 mg/dL — ABNORMAL HIGH (ref 0.0–0.2)
Indirect Bilirubin: 0.9 mg/dL (ref 0.3–0.9)
Total Bilirubin: 1.5 mg/dL — ABNORMAL HIGH (ref 0.3–1.2)
Total Protein: 7 g/dL (ref 6.5–8.1)

## 2020-05-30 LAB — LIPASE, BLOOD: Lipase: 31 U/L (ref 11–51)

## 2020-05-30 MED ORDER — IOHEXOL 300 MG/ML  SOLN
100.0000 mL | Freq: Once | INTRAMUSCULAR | Status: AC | PRN
  Administered 2020-05-30: 100 mL via INTRAVENOUS

## 2020-05-30 MED ORDER — MORPHINE SULFATE (PF) 4 MG/ML IV SOLN
4.0000 mg | Freq: Once | INTRAVENOUS | Status: AC
  Administered 2020-05-30: 4 mg via INTRAVENOUS
  Filled 2020-05-30: qty 1

## 2020-05-30 MED ORDER — LACTATED RINGERS IV BOLUS
1000.0000 mL | Freq: Once | INTRAVENOUS | Status: AC
  Administered 2020-05-30: 1000 mL via INTRAVENOUS

## 2020-05-30 NOTE — ED Provider Notes (Signed)
[facility] EMERGENCY DEPARTMENT Provider Note   CSN: [id number] Arrival date & time: 05/29/20  1429     History Chief Complaint  Patient presents with  . Abdominal Pain  . Chest Pain    Amanda Monroe is a 69 y.o. female.   Abdominal Pain Pain location:  RLQ Pain quality: aching   Pain radiates to:  LUQ and L flank Pain severity:  Moderate Onset quality:  Gradual Duration:  7 days Timing:  Constant Progression:  Worsening Chronicity:  New Context comment:  New med 3 weeks ago for hep c Worsened by:  Nothing Ineffective treatments:  None tried Associated symptoms: chest pain   Associated symptoms: no chills, no cough, no diarrhea, no dysuria, no fever, no nausea, no shortness of breath and no vomiting   Chest Pain Associated symptoms: abdominal pain   Associated symptoms: no back pain, no cough, no fever, no headache, no nausea, no palpitations, no shortness of breath and no vomiting        Past Medical History:  Diagnosis Date  . Cirrhosis of liver (HCC)   . Depression   . Hepatitis C     There are no problems to display for this patient.   Past Surgical History:  Procedure Laterality Date  . ABDOMINAL HYSTERECTOMY    . BACK SURGERY    . HIP SURGERY    . KNEE SURGERY    . KNEE SURGERY       OB History   No obstetric history on file.     History reviewed. No pertinent family history.  Social History   Tobacco Use  . Smoking status: Never Smoker  . Smokeless tobacco: Never Used  Substance Use Topics  . Alcohol use: No  . Drug use: Not Currently    Home Medications Prior to Admission medications   Medication Sig Start Date End Date Taking? Authorizing Provider  albuterol (ACCUNEB) 1.25 MG/3ML nebulizer solution Take 3 mLs by nebulization every 2 (two) hours as needed for shortness of breath. 10/12/19   [provider]  albuterol (VENTOLIN HFA) 108 (90 Base) MCG/ACT inhaler Inhale 1 puff into the lungs every 4  (four) hours as needed for wheezing or shortness of breath. 09/24/19   [provider]  furosemide (LASIX) 40 MG tablet Take 40 mg by mouth daily. 10/20/19   [provider]  lactulose (CHRONULAC) 10 GM/15ML solution Take 10 g by mouth daily as needed for constipation. 09/26/19   [provider]  Oxycodone HCl 10 MG TABS Take 10 mg by mouth 3 (three) times daily. 10/25/19   [provider]  pantoprazole (PROTONIX) 40 MG tablet Take 40 mg by mouth every morning. 08/25/19   [provider]  predniSONE (DELTASONE) 50 MG tablet Take 1 tablet (50 mg total) by mouth daily. 11/12/19   Tegeler, Canary Brim, MD  propranolol (INDERAL) 10 MG tablet Take 10 mg by mouth at bedtime. 08/12/19   [provider]  spironolactone (ALDACTONE) 100 MG tablet Take 100 mg by mouth daily. 09/06/19   [provider]  XIFAXAN 550 MG TABS tablet Take 550 mg by mouth 2 (two) times daily. 10/08/19   [provider]    Allergies    Aspirin  Review of Systems   Review of Systems  Constitutional: Negative for chills and fever.  HENT: Negative for congestion and rhinorrhea.   Respiratory: Negative for cough and shortness of breath.   Cardiovascular: Positive for chest pain. Negative for palpitations.  Gastrointestinal: Positive for abdominal pain. Negative for diarrhea, nausea and vomiting.  Genitourinary: Negative for difficulty urinating and dysuria.  Musculoskeletal: Negative for arthralgias and back pain.  Skin: Negative for rash and wound.  Neurological: Negative for light-headedness and headaches.    Physical Exam Updated Vital Signs BP 131/63   Pulse 66   Temp 98.6 F (37 C)   Resp (!) 22   Ht 5\' 3"  (1.6 m)   Wt 61.2 kg   SpO2 98%   BMI 23.91 kg/m   Physical Exam Vitals and nursing note reviewed. Exam conducted with a chaperone present.  Constitutional:      General: She is not in acute distress.    Appearance: Normal appearance.   HENT:     Head: Normocephalic and atraumatic.     Nose: No rhinorrhea.  Eyes:     General:        Right eye: No discharge.        Left eye: No discharge.     Conjunctiva/sclera: Conjunctivae normal.  Cardiovascular:     Rate and Rhythm: Normal rate and regular rhythm.  Pulmonary:     Effort: Pulmonary effort is normal. No respiratory distress.     Breath sounds: No stridor.  Abdominal:     General: Abdomen is flat. There is no distension.     Palpations: Abdomen is soft.  Musculoskeletal:        General: No tenderness or signs of injury.  Skin:    General: Skin is warm and dry.  Neurological:     General: No focal deficit present.     Mental Status: She is alert. Mental status is at baseline.     Motor: No weakness.  Psychiatric:        Mood and Affect: Mood normal.        Behavior: Behavior normal.     ED Results / Procedures / Treatments   Labs (all labs ordered are listed, but only abnormal results are displayed) Labs Reviewed  BASIC METABOLIC PANEL - Abnormal; Notable for the following components:      Result Value   Potassium 3.3 (*)    Chloride 97 (*)    Creatinine, Ser 1.06 (*)    Calcium 8.8 (*)    GFR, Estimated 57 (*)    All other components within normal limits  CBC - Abnormal; Notable for the following components:   WBC 3.9 (*)    RBC 3.68 (*)    MCH 34.2 (*)    Platelets 74 (*)    All other components within normal limits  HEPATIC FUNCTION PANEL - Abnormal; Notable for the following components:   Albumin 2.8 (*)    AST 54 (*)    Alkaline Phosphatase 150 (*)    Total Bilirubin 1.5 (*)    Bilirubin, Direct 0.6 (*)    All other components within normal limits  LIPASE, BLOOD  URINALYSIS, ROUTINE W REFLEX MICROSCOPIC  TROPONIN I (HIGH SENSITIVITY)  TROPONIN I (HIGH SENSITIVITY)    EKG EKG Interpretation  Date/Time:  Monday May 29 2020 14:32:42 EDT Ventricular Rate:  74 PR Interval:  118 QRS Duration: 80 QT Interval:  394 QTC  Calculation: 437 R Axis:   59 Text Interpretation: Sinus rhythm with Premature atrial complexes Otherwise normal ECG Confirmed by [id number] (Cherlynn Perches) on 05/30/2020 1:51:24 AM   Radiology DG Chest 2 View  Result Date: 05/29/2020 CLINICAL DATA:  Chest pain EXAM: CHEST - 2 VIEW COMPARISON:  11/12/2019 FINDINGS: The heart  size and mediastinal contours are within normal limits. Both lungs are clear. No pleural effusion or pneumothorax. Partially imaged lumbar spine fusion. Cholecystectomy clips. IMPRESSION: No acute process in the chest. Electronically Signed   By: Guadlupe Spanish M.D.   On: 05/29/2020 15:08   CT ABDOMEN PELVIS W CONTRAST  Result Date: 05/30/2020 CLINICAL DATA:  Right lower quadrant abdominal pain EXAM: CT ABDOMEN AND PELVIS WITH CONTRAST TECHNIQUE: Multidetector CT imaging of the abdomen and pelvis was performed using the standard protocol following bolus administration of intravenous contrast. CONTRAST:  OMNIPAQUE IOHEXOL 300 MG/ML  SOLN COMPARISON:  10/20/2007 FINDINGS: Lower chest: Visualized lung bases are clear. The visualized heart and pericardium are unremarkable. 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 Hepatobiliary: The liver is cirrhotic. Recanalization of the umbilical vein is seen in keeping with changes of portal venous hypertension. The main, right and left portal veins are patent. No enhancing intrahepatic mass is identified. Cholecystectomy has been performed. Mild to moderate extrahepatic biliary ductal dilation is nonspecific and may simply represent post cholecystectomy change Pancreas: Unremarkable Spleen: Unremarkable Adrenals/Urinary Tract: Adrenal glands are unremarkable. Kidneys are normal, without renal calculi, focal lesion, or hydronephrosis. Bladder is unremarkable. Stomach/Bowel: Stomach, small bowel, and large bowel are unremarkable save for moderate sigmoid diverticulosis. No evidence of obstruction or focal inflammation. Appendix is not clearly  identified and may be absent. There is moderate ascites present extending into the inguinal canals bilaterally. Vascular/Lymphatic: Mild aortoiliac atherosclerotic calcification. No aortic aneurysm. No pathologic adenopathy within the abdomen and pelvis. Reproductive: Status post hysterectomy. No adnexal masses. Other: None significant Musculoskeletal: L3-S1 posterior lumbar fusion with instrumentation has been performed with resection of the spinous process of L4. There is advanced degenerative disc disease with grade 1 anterolisthesis of L2 upon L3. No acute bone abnormality. IMPRESSION: No definite radiographic explanation for the patient's right lower quadrant abdominal pain. Cirrhosis with evidence of portal venous hypertension with portosystemic collateralization including small gastroesophageal varices and moderate ascites. Aortic Atherosclerosis (ICD10-I70.0). Electronically Signed   By: Helyn Numbers MD   On: 05/30/2020 03:21    Procedures Ultrasound ED FAST  Date/Time: 05/30/2020 1:51 AM Performed by: Sabino Donovan, MD Authorized by: Sabino Donovan, MD  Procedure details:    Indications comment:  Hx of cirrhosis and ab pain, eval for free acites    Assess for:  Intra-abdominal fluid    Technique:  Abdominal    Images: archived      Abdominal findings:    L kidney:  Visualized   R kidney:  Visualized   Liver:  Visualized   Bladder:  Visualized,    Hepatorenal space visualized: identified     Splenorenal space: identified     Rectovesical free fluid: identified     Splenorenal free fluid: identified     Hepatorenal space free fluid: identified     (including critical care time)  Medications Ordered in ED Medications  lactated ringers bolus 1,000 mL ( Intravenous Restarted 05/30/20 0327)  iohexol (OMNIPAQUE) 300 MG/ML solution 100 mL (100 mLs Intravenous Contrast Given 05/30/20 0215)  morphine 4 MG/ML injection 4 mg (4 mg Intravenous Given 05/30/20 0247)    ED Course  I have  reviewed the triage vital signs and the nursing notes.  Pertinent labs & imaging results that were available during my care of the patient were reviewed by me and considered in my medical decision making (see chart for details).    MDM Rules/Calculators/A&P  [age] year old female comes with 1 week of right lower quadrant pain that radiates to her upper abdomen the left chest.  EKG shows no acute ischemic change interval abnormality or arrhythmia.  Troponin is negative.  BMP and CBC done in triage are unremarkable for significant derangements.  I have added liver function tests and pancreatic tests.  I did bedside ultrasound to evaluate for free fluid and she has trace free fluid, likely secondary to her history of cirrhosis that she says is secondary to hepatitis C.  She is undergoing treatment with gastroenterology in [place] treatment plan transition to year.  She is well-appearing has a soft abdomen but does have tenderness in the right lower quadrant.  We will get CT imaging.  Her only surgical history in her abdomen is a hysterectomy.  Patient's remaining laboratory studies are fairly unremarkable after my review other than a mildly elevated T bili, with her history of cirrhosis and hepatitis C this is not surprising. On exam her abdomen is soft without peritoneal signs. We had a conversation about possible need of peritoneal fluid sampling however the pocket is so small I do not think it is worth the risk of going after at this time. She is with normal vital signs, she is with a benign abdominal exam. She can follow-up with local gastroenterology. She agrees this plan and feels the same. Discharge home strict return precautions provided  Final Clinical Impression(s) / ED Diagnoses Final diagnoses:  Undifferentiated abdominal pain    Rx / DC Orders ED Discharge Orders    None       Sabino DonovanKatz, Analis Distler C, MD 05/30/20 [phone]

## 2021-04-07 IMAGING — DX DG CHEST 1V PORT
1 series · 1 of 1 positions shown · non-contrast
Comparison: 02/23/2016

CLINICAL DATA: Cough and shortness of breath

EXAM:
PORTABLE CHEST 1 VIEW

[chest ap]
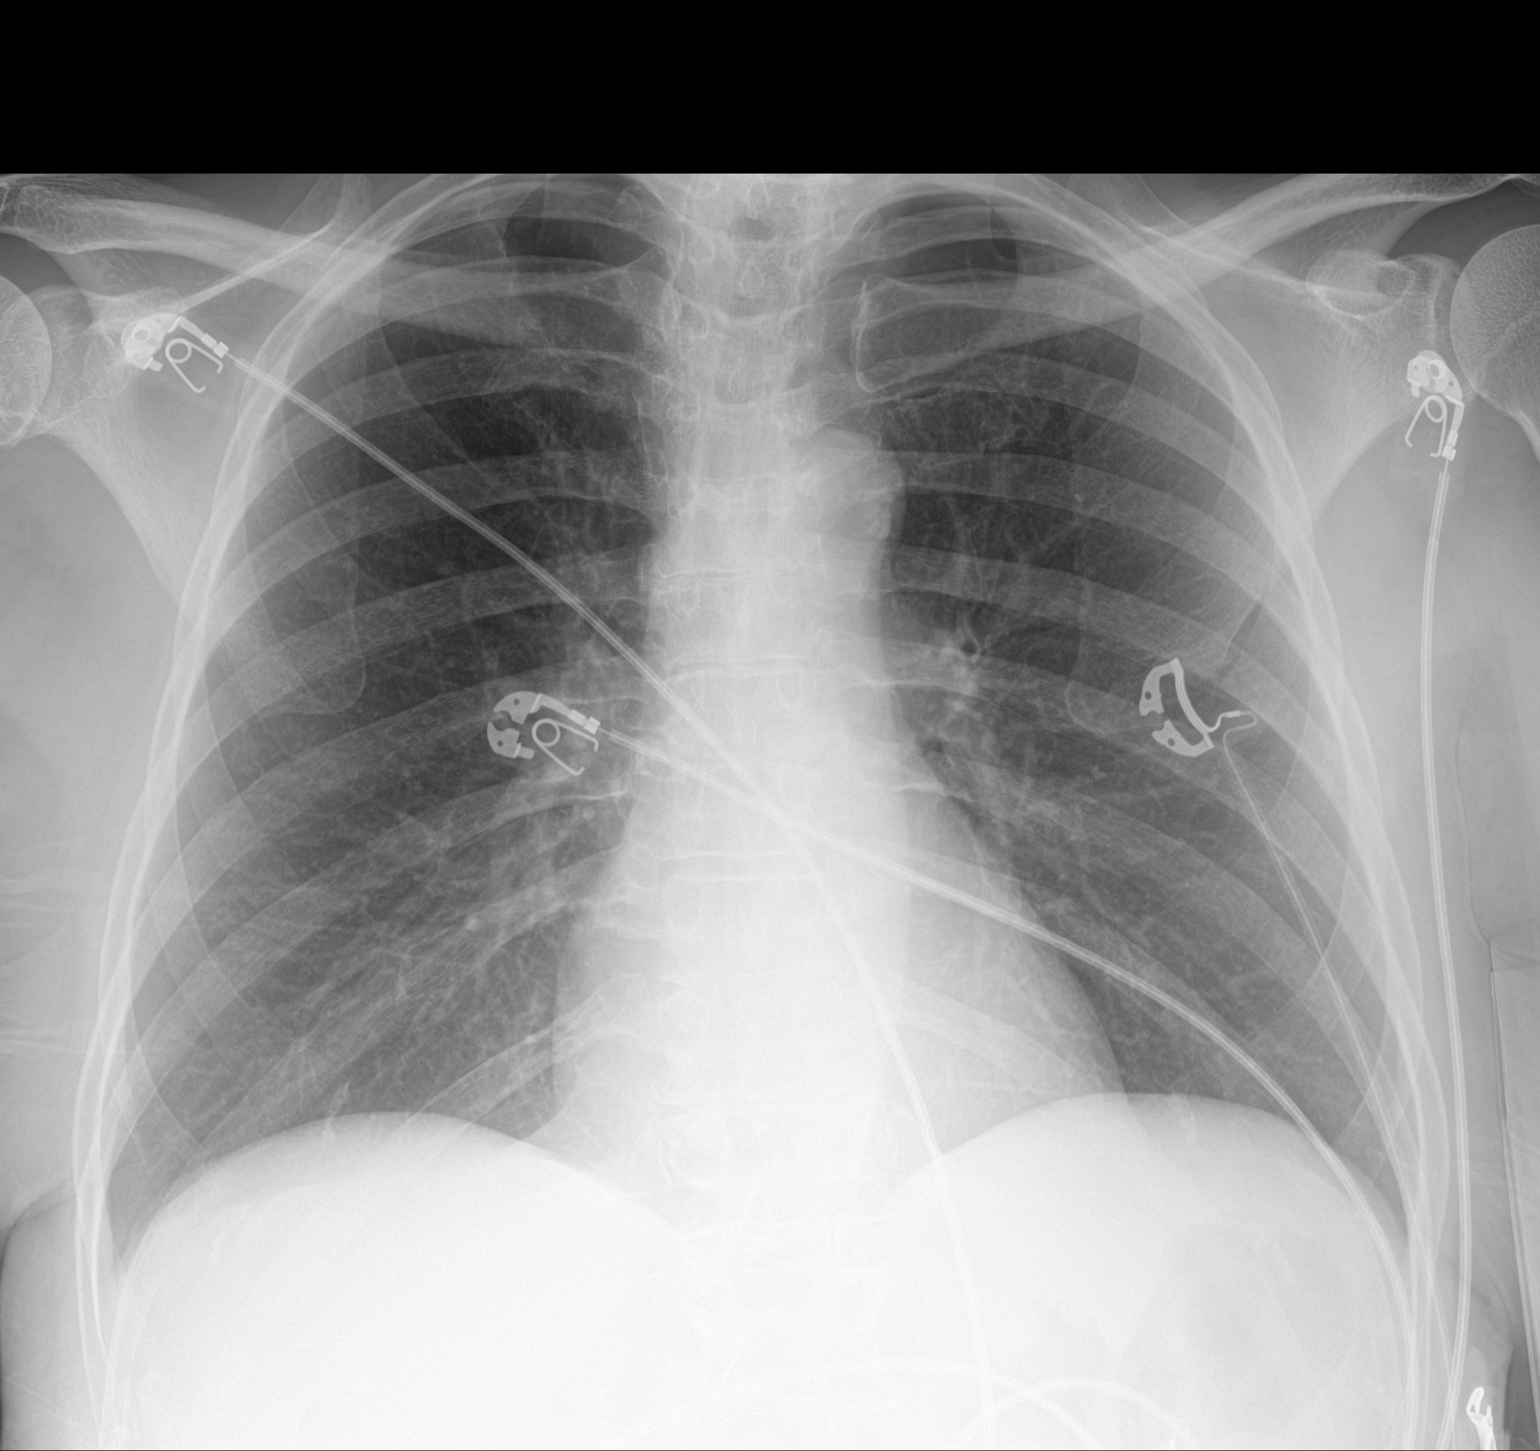

[1 of 1 positions shown; findings below may reference images not displayed]

FINDINGS: The heart size and mediastinal contours are within normal limits.
Both lungs are clear. The visualized skeletal structures are
unremarkable.
IMPRESSION: No active disease.

## 2022-01-23 NOTE — Telephone Encounter (Signed)
Formatting of this note might be different from the original.  I do not know anyone, sorry  Electronically signed by Donia Guiles, MD at 01/23/2022  1:22 PM EDT

## 2022-01-23 NOTE — Telephone Encounter (Signed)
Formatting of this note might be different from the original.  Lvm for pt advising of message.  Electronically signed by Iven Finn, MA at 01/23/2022  1:37 PM EDT

## 2022-01-23 NOTE — Telephone Encounter (Signed)
Formatting of this note might be different from the original.  Please advise if you know anyone in this area?  Electronically signed by Iven Finn, MA at 01/23/2022  9:28 AM EDT

## 2022-01-23 NOTE — Telephone Encounter (Signed)
Formatting of this note might be different from the original.  Copied from CRM #[id number]. Topic: Clinical Concerns - Call Back  >> Jan 23, 2022  9:16 AM Tresa Endo wrote:  Mordecai Maes, Mahima B returned call; no encounter. Created a message and sent to Clinical Pool; advised of 1 business day SLA.  [facility] Donia Guiles  >> Jan 23, 2022  9:19 AM Tresa Endo wrote:  Zettie Pho B is calling other request     Include all details related to the request(s) below:  Pt calling in asking to see if Dr. Ricki Miller can help her find a PCP in [facility], states area code [address] this is where pt resides now, states she was told doctor would help her, please advise    Confirm and type the Best Contact Number below:    Patient/caller contact number:  [phone]             [] Home  [x] Mobile  [] Work  []  Other   [x]  Okay to leave a voicemail    Medication List:    Current Outpatient Medications:     albuterol 1.25 mg/3 mL nebulizer solution, Inhale 1.25 mg. (Patient not taking: Reported on 07/03/2021), Disp: , Rfl:     albuterol HFA (PROVENTIL HFA;VENTOLIN HFA;PROAIR HFA) 90 mcg/actuation inhaler, USE 1 INHALATION EVERY 4 TO 6 HOURS AS NEEDED, Disp: 6.7 each, Rfl: 1    furosemide (LASIX) 40 mg tablet, Take 1 tablet (40 mg total) by mouth Once Daily., Disp: 90 tablet, Rfl: 3    oxyCODONE (ROXICODONE) 10 mg tab, Take 1 tablet (10 mg total) by mouth 3 (three) times a day., Disp: 90 tablet, Rfl: 0    propranoloL (INDERAL) 10 mg tablet, Take 1 tablet (10 mg total) by mouth nightly., Disp: 90 tablet, Rfl: 3    Qvar RediHaler 40 mcg/actuation, Inhale 1 puff (40 mcg total)  in the morning and 1 puff (40 mcg total) in the evening., Disp: 10.6 g, Rfl: 2    Medication Request/Refills: Pharmacy Information (if applicable)     [x]  Not Applicable         []  Pharmacy listed  Send Medication Request to:    []  Pharmacy not listed (added to pharmacy list in Epic) Send Medication Request to:       Listed Pharmacies:  [id number] , - [address] - PHONE: [phone] - FAX: [phone]  [id number] - , [place] - [address] AT [facility] [address] - PHONE: - FAX: [phone]  [id number] - [place], [place] - [address] AT [facility] - PHONE: [phone] - FAX: [phone]  [facility] - [place], [place] Otto - [address] - PHONE: [phone] - FAX: [phone]        Electronically signed by [facility] at 01/23/2022  9:21 AM EDT

## 2022-01-28 ENCOUNTER — Inpatient Hospital Stay: Admit: 2022-01-28 | Payer: MEDICARE | Primary: Student in an Organized Health Care Education/Training Program

## 2022-01-28 ENCOUNTER — Encounter

## 2022-01-28 DIAGNOSIS — R058 Other specified cough: Secondary | ICD-10-CM

## 2022-02-21 ENCOUNTER — Inpatient Hospital Stay: Admit: 2022-02-21 | Payer: MEDICARE | Primary: Student in an Organized Health Care Education/Training Program

## 2022-02-21 ENCOUNTER — Ambulatory Visit
Admit: 2022-02-21 | Discharge: 2022-02-25 | Payer: MEDICARE | Attending: Student in an Organized Health Care Education/Training Program | Primary: Student in an Organized Health Care Education/Training Program

## 2022-02-21 DIAGNOSIS — Z0000 Encounter for general adult medical examination without abnormal findings: Secondary | ICD-10-CM

## 2022-02-21 DIAGNOSIS — Z13220 Encounter for screening for lipoid disorders: Secondary | ICD-10-CM

## 2022-02-21 LAB — COMPREHENSIVE METABOLIC PANEL
ALT: 47 U/L (ref 13–56)
AST: 78 U/L — ABNORMAL HIGH (ref 10–38)
Albumin/Globulin Ratio: 0.6 — ABNORMAL LOW (ref 0.8–1.7)
Albumin: 2.5 g/dL — ABNORMAL LOW (ref 3.4–5.0)
Alk Phosphatase: 215 U/L — ABNORMAL HIGH (ref 45–117)
Anion Gap: 3 mmol/L (ref 3.0–18)
BUN: 12 MG/DL (ref 7.0–18)
Bun/Cre Ratio: 19 (ref 12–20)
CO2: 33 mmol/L — ABNORMAL HIGH (ref 21–32)
Calcium: 8.4 MG/DL — ABNORMAL LOW (ref 8.5–10.1)
Chloride: 105 mmol/L (ref 100–111)
Creatinine: 0.64 MG/DL (ref 0.6–1.3)
Est, Glom Filt Rate: >60 mL/min/{1.73_m2} (ref >60)
Globulin: 4.3 g/dL — ABNORMAL HIGH (ref 2.0–4.0)
Glucose: 117 mg/dL — ABNORMAL HIGH (ref 74–99)
Potassium: 3.4 mmol/L — ABNORMAL LOW (ref 3.5–5.5)
Sodium: 141 mmol/L (ref 136–145)
Total Bilirubin: 1.6 MG/DL — ABNORMAL HIGH (ref 0.2–1.0)
Total Protein: 6.8 g/dL (ref 6.4–8.2)

## 2022-02-21 LAB — LIPID PANEL
Chol/HDL Ratio: 1.4 (ref 0–5.0)
Cholesterol, Total: 92 MG/DL (ref <200)
HDL: 67 MG/DL — ABNORMAL HIGH (ref 40–60)
LDL Calculated: 17.4 MG/DL (ref 0–100)
Triglycerides: 38 MG/DL (ref <150)
VLDL Cholesterol Calculated: 7.6 MG/DL

## 2022-02-21 MED ORDER — ESCITALOPRAM OXALATE 10 MG PO TABS
10 MG | ORAL_TABLET | Freq: Every day | ORAL | 3 refills | Status: DC
Start: 2022-02-21 — End: 2022-10-02

## 2022-02-21 MED ORDER — OXYCODONE HCL 10 MG PO TABS
10 MG | ORAL_TABLET | Freq: Three times a day (TID) | ORAL | 0 refills | Status: AC | PRN
Start: 2022-02-21 — End: 2022-02-28

## 2022-02-21 MED ORDER — ZOSTER VAC RECOMB ADJUVANTED 50 MCG/0.5ML IM SUSR
50 MCG/0.5ML | Freq: Once | INTRAMUSCULAR | 1 refills | Status: AC
Start: 2022-02-21 — End: 2022-02-21

## 2022-02-21 NOTE — Progress Notes (Signed)
1. "Have you been to the ER, urgent care clinic since your last visit?  Hospitalized since your last visit?" No    2. "Have you seen or consulted any other health care providers outside of the [facility] since your last visit?"  Yes, PCP- ([place] [place]- [phone], Liver Specialist- Worthy Rancher Schmeltzer- [phone] & Hepatology- (saw 1x) Dawn A. Darzel [phone]      3. For patients aged 72-75: Has the patient had a colonoscopy / FIT/ Cologuard? Yes - Care Gap present. Rooming MA/LPN to request most recent results      If the patient is female:    4. For patients aged 87-74: Has the patient had a mammogram within the past 2 years? No      5. For patients aged 21-65: Has the patient had a pap smear? NA - based on age or sex

## 2022-02-21 NOTE — Progress Notes (Signed)
History and Physical      Brianna Velazquez  Date of Birth:  May 12, 1951    Date of Service:  02/21/2022    Chief Complaint:   Brianna Velazquez is a 72 y.o. female who presents for complete physical examination.    HPI: Pleasant 71 year old female with a past medical history of HTN, depression, chronic low back pain on chronic opioid use, OA of the left knee who presents today to establish care and for CPE.      Patient states she was previously under the care of a provider (PCP) in [place] who managed her opioid medications.  Patient endorses ongoing depression.  She denies any history of suicidal ideations or intentions of hurting anybody.  Patient at this time denies any acute medical issues    Wt Readings from Last 3 Encounters:   02/21/22 140 lb 3.2 oz (63.6 kg)     BP Readings from Last 3 Encounters:   02/21/22 93/66       There is no problem list on file for this patient.      Preventive Care:  Health Maintenance   Topic Date Due    Hepatitis C screen  Never done    DTaP/Tdap/Td vaccine (1 - Tdap) Never done    Colorectal Cancer Screen  Never done    Breast cancer screen  Never done    Shingles vaccine (1 of 2) Never done    DEXA (modify frequency per FRAX score)  Never done    Pneumococcal 65+ years Vaccine (2 - PPSV23 if available, else PCV20) 02/27/2019    COVID-19 Vaccine (4 - Booster for Pfizer series) 08/23/2020    Annual Wellness Visit (AWV)  01/28/2022    Flu vaccine (1) 02/26/2022    Depression Monitoring  02/22/2023    Lipids  02/22/2027    Hepatitis A vaccine  Aged Out    Hib vaccine  Aged Out    Meningococcal (ACWY) vaccine  Aged Out    Depression Screen  Discontinued      Hx abnormal PAP: no  Sexual activity: none   Self-breast exams: yes  Previous DEXA scan: Does not recall  Last eye exam: 6 months ago, normal  Exercise: no regular exercise  Seatbelt use: Yes  Lipid panel:   Lab Results   Component Value Date    CHOL 92 02/21/2022    TRIG 38 02/21/2022    HDL 67 (H) 02/21/2022    LDLCALC 17.4  02/21/2022        Living will:  no,   Patient referred to [facility] Clinical Specialist    Immunization History   Administered Date(s) Administered    COVID-19, PFIZER PURPLE top, DILUTE for use, (age 46 y+), 67mg/0.3mL 11/20/2019, 12/24/2019, 06/28/2020    Influenza, FLUZONE (age 6262y+), High Dose, 0.761m12/12/2020    MMR, PRIORIX, M-M-R II, (age 7664m SC, 0.5mL24m/27/2007    Pneumococcal, PCV-13, PREVNAR 13, 12ge 6w+), IM, 0.5mL 44m01/2019       Allergies   Allergen Reactions    Aspirin Anaphylaxis and Itching     Outpatient Medications Marked as Taking for the 02/21/22 encounter (Office Visit) with HenryDossie Der  Medication Sig Dispense Refill    spironolactone (ALDACTONE) 100 MG tablet Take 1 tablet by mouth daily      XIFAXAN 550 MG tablet Take 1 tablet by mouth 2 times daily      propranolol (INDERAL) 10 MG tablet Take 1 tablet by mouth nightly  pantoprazole (PROTONIX) 40 MG tablet Take 1 tablet by mouth every morning      montelukast (SINGULAIR) 10 MG tablet       furosemide (LASIX) 40 MG tablet Take 1 tablet by mouth daily      doxycycline hyclate (VIBRA-TABS) 100 MG tablet       QVAR REDIHALER 40 MCG/ACT AERB inhaler INHALE 1 PUFF BY MOUTH TWICE A DAY (MORNING AND EVENING)      albuterol (ACCUNEB) 1.25 MG/3ML nebulizer solution Inhale 3 mLs into the lungs      albuterol sulfate HFA (PROVENTIL;VENTOLIN;PROAIR) 108 (90 Base) MCG/ACT inhaler USE 1 INHALATION EVERY 4 TO 6 HOURS AS NEEDED      carbamide peroxide (DEBROX) 6.5 % otic solution 5 drops 2 times daily      Menthol, Topical Analgesic, 10 % LIQD Apply topically      tetrahydrozoline 0.05 % ophthalmic solution 1 drop 3 times daily      [EXPIRED] zoster recombinant adjuvanted vaccine (SHINGRIX) 50 MCG/0.5ML SUSR injection Inject 0.5 mLs into the muscle once for 1 dose 50 MCG IM then repeat 2-6 months. 1 each 1    escitalopram (LEXAPRO) 10 MG tablet Take 1 tablet by mouth daily 30 tablet 3    oxyCODONE HCl (OXY-IR) 10 MG immediate release tablet  Take 1 tablet by mouth every 8 hours as needed for Pain for up to 7 days. Max Daily Amount: 30 mg 21 tablet 0       History reviewed. No pertinent past medical history.  No past surgical history on file.  No family history on file.  Social History     Socioeconomic History    Marital status: Single     Spouse name: Not on file    Number of children: Not on file    Years of education: Not on file    Highest education level: Not on file   Occupational History    Not on file   Tobacco Use    Smoking status: Never    Smokeless tobacco: Never   Vaping Use    Vaping Use: Never used   Substance and Sexual Activity    Alcohol use: Not Currently    Drug use: Never    Sexual activity: Not on file   Other Topics Concern    Not on file   Social History Narrative    Not on file     Social Determinants of Health     Financial Resource Strain: High Risk    Difficulty of Paying Living Expenses: Hard   Food Insecurity: Food Insecurity Present    Worried About Running Out of Food in the Last Year: Sometimes true    Ran Out of Food in the Last Year: Sometimes true   Transportation Needs: Unmet Transportation Needs    Lack of Transportation (Medical): Not on file    Lack of Transportation (Non-Medical): Yes   Physical Activity: Not on file   Stress: Not on file   Social Connections: Not on file   Intimate Partner Violence: Not on file   Housing Stability: Unknown    Unable to Pay for Housing in the Last Year: Not on file    Number of Places Lived in the Last Year: Not on file    Unstable Housing in the Last Year: No       Review of Systems:  Review of Systems   Constitutional:  Negative for activity change, appetite change, fatigue and fever.   HENT:  Negative  for congestion, hearing loss, postnasal drip, rhinorrhea, sore throat, trouble swallowing and voice change.    Eyes:  Negative for photophobia, discharge, itching and visual disturbance.   Respiratory:  Negative for cough, shortness of breath and wheezing.    Cardiovascular:   Negative for chest pain, palpitations and leg swelling.   Gastrointestinal:  Negative for abdominal pain, constipation, diarrhea, nausea and vomiting.   Genitourinary:  Negative for difficulty urinating and dysuria.   Musculoskeletal:  Positive for arthralgias and back pain.   Skin:  Negative for rash.   Neurological:  Negative for dizziness, weakness, light-headedness and headaches.   Psychiatric/Behavioral:  Negative for sleep disturbance. The patient is not nervous/anxious.         Physical Exam:   Vitals:    02/21/22 1100 02/21/22 1122 02/21/22 1235   BP: (!) 158/89 (!) 159/82 93/66   Site: Right Lower Arm Right Lower Arm Right Upper Arm   Position: Sitting Sitting Sitting   Cuff Size: Medium Adult Medium Adult Medium Adult   Pulse: 83 83 78   Resp: '16 16 16   '$ Temp: 97.5 F (36.4 C)     TempSrc: Temporal     SpO2: 96%     Weight: 140 lb 3.2 oz (63.6 kg)     Height: '5\' 3"'$  (1.6 m)       Body mass index is 24.84 kg/m.   Physical Exam  Constitutional:       General: She is not in acute distress.     Appearance: Normal appearance. She is not ill-appearing, toxic-appearing or diaphoretic.   HENT:      Head: Normocephalic and atraumatic.      Right Ear: Tympanic membrane, ear canal and external ear normal. There is no impacted cerumen.      Left Ear: Tympanic membrane, ear canal and external ear normal. There is no impacted cerumen.      Nose: Nose normal. No congestion or rhinorrhea.      Mouth/Throat:      Mouth: Mucous membranes are moist.      Pharynx: Oropharynx is clear. No oropharyngeal exudate or posterior oropharyngeal erythema.   Eyes:      General: No scleral icterus.        Right eye: No discharge.         Left eye: No discharge.      Extraocular Movements: Extraocular movements intact.      Conjunctiva/sclera: Conjunctivae normal.      Pupils: Pupils are equal, round, and reactive to light.   Cardiovascular:      Rate and Rhythm: Normal rate and regular rhythm.      Pulses: Normal pulses.      Heart  sounds: Normal heart sounds. No murmur heard.    No friction rub. No gallop.   Pulmonary:      Effort: Pulmonary effort is normal.      Breath sounds: Normal breath sounds. No wheezing, rhonchi or rales.   Abdominal:      General: Abdomen is flat. Bowel sounds are normal.      Palpations: Abdomen is soft. There is no mass.      Tenderness: There is no abdominal tenderness. There is no right CVA tenderness or left CVA tenderness.   Musculoskeletal:         General: Tenderness (Low back) present.      Cervical back: Normal range of motion and neck supple.      Right lower leg: No edema.  Left lower leg: No edema.   Lymphadenopathy:      Cervical: No cervical adenopathy.   Neurological:      General: No focal deficit present.      Mental Status: She is alert and oriented to person, place, and time.   Psychiatric:         Mood and Affect: Mood normal.         Behavior: Behavior normal.         Assessment/Plan:    Vanshika was seen today for new patient.    Diagnoses and all orders for this visit:    Well adult exam  71 y.o. lady presenting today to establish care and for CPE.  Patient at this time denies any acute medical issues and appears in stable health.  -     Comprehensive Metabolic Panel; Future  -     CBC; Future    Major depressive disorder, remission status unspecified, unspecified whether recurrent  Pleasant 71 y.o. female with a past medical history of anxiety and depression presenting today for evaluation.  PHQ 9:->21,   Given prior hx , Lexapro 10 mg daily ordered.  Consider increasing dosage as tolerated per sx.  Patient counseled on importance of establishing care with a behavioral therapist/psychiatrist.  Patient will be followed up in 1 month for reassessment  Patient was advised to call the crisis hotline for counseling if needed.  -     escitalopram (LEXAPRO) 10 MG tablet; Take 1 tablet by mouth daily    Chronic low back pain, unspecified back pain laterality, unspecified whether sciatica  present  Comments:  Patient has been on chronic opioid use since 2017.  We will prescribe a 7-day course and refer to pain management 2/2 chronic opioid use  Orders:  -     oxyCODONE HCl (OXY-IR) 10 MG immediate release tablet; Take 1 tablet by mouth every 8 hours as needed for Pain for up to 7 days. Max Daily Amount: 30 mg  -     External Referral To Pain Clinic    Chronic pain of left knee  Comments:  See plan chronic back pain  Orders:  -     oxyCODONE HCl (OXY-IR) 10 MG immediate release tablet; Take 1 tablet by mouth every 8 hours as needed for Pain for up to 7 days. Max Daily Amount: 30 mg  -     External Referral To Pain Clinic    Screening for hyperlipidemia  -     Lipid Panel; Future    Advanced care planning/counseling discussion  -     Ambulatory Referral to [facility] Clinical Specialist    Encounter for hepatitis C screening test for low risk patient  -     Hepatitis C Ab, Rflx to Qt by PCR; Future    Screening for colon cancer  -     FIT-DNA (Cologuard)    Encounter for screening mammogram for breast cancer  -     MAM TOMO DIGITAL SCREEN BILATERAL [[id number]]; Future    Need for prophylactic vaccination and inoculation against varicella  -     zoster recombinant adjuvanted vaccine [facility]) 50 MCG/0.5ML SUSR injection; Inject 0.5 mLs into the muscle once for 1 dose 50 MCG IM then repeat 2-6 months.    Post-menopausal  -     DEXA Bone Density Axial Skeleton; Future      Return in about 4 weeks (around 03/21/2022) for AWV, HTN and depression, then well visit in 1 year.  This note was dictated utilizing voice recognition software which may lead to typographical errors.  I apologize in advance if the situation occurs.  If questions arise please do not hesitate to contact me or call our department.

## 2022-02-21 NOTE — Patient Instructions (Addendum)
Given his secondPatient Education        Well Visit, Over 87: Care Instructions  Well visits can help you stay healthy. Your doctor has checked your overall health and may have suggested ways to take good care of yourself. Your doctor also may have recommended tests. You can help prevent illness with healthy eating, good sleep, vaccinations, regular exercise, and other steps.    Get the tests that you and your doctor decide on. Depending on your age and risks, examples might include hearing tests as well as screening for colon, breast, and lung cancer. Screening helps find diseases before any symptoms appear.   Eat healthy foods. Choose fruits, vegetables, whole grains, lean protein, and low-fat dairy foods. Limit saturated fat, and reduce salt.     Limit alcohol. Men should have no more than 2 drinks a day. Women should have no more than 1. For some people, no alcohol is the best choice.   Exercise. It can help prevent falls. Get at least 30 minutes of exercise on most days of the week. Walking, yoga, and tai chi can be good choices.     Reach and stay at your healthy weight. This will lower your risk for many health problems.   Take care of your mental health. Try to stay connected with friends, family, and community, and find ways to manage stress.     If you're feeling depressed or hopeless, talk to someone. A counselor can help. If you don't have a counselor, talk to your doctor.   Talk to your doctor if you think you may have a problem with alcohol or drug use. This includes prescription medicines and illegal drugs.     Avoid tobacco and nicotine: Don't smoke, vape, or chew. If you need help quitting, talk to your doctor.   Practice safer sex. Getting tested, using condoms or dental dams, and limiting sex partners can help prevent STIs.     Make an advance directive. This is a legal way to tell your family and doctor what you want to happen at the end of your life or when you can't speak for yourself.    Prevent problems where you can. Protect your skin from too much sun, wash your hands, brush your teeth twice a day, and wear a seat belt in the car.   Where can you learn more?  Go to https://www.bennett.info/ and enter K859 to learn more about "Well Visit, Over 65: Care Instructions."  Current as of: June 11, 2021               Content Version: 13.7   2006-2023 Healthwise, Incorporated.   Care instructions adapted under license by [facility]. If you have questions about a medical condition or this instruction, always ask your healthcare professional. [place] any warranty or liability for your use of this information.         Patient Education        A Healthy Lifestyle: Care Instructions  A healthy lifestyle can help you feel good, have more energy, and stay at a weight that's healthy for you. You can share a healthy lifestyle with your friends and family. And you can do it on your own.    Eat meals with your friends or family. You could try cooking together.   Plan activities with other people. Go for a walk with a friend, try a free online fitness class, or join a sports league.     Eat a variety  of healthy foods. These include fruits, vegetables, whole grains, low-fat dairy, and lean protein.   Choose healthy portions of food. You can use the Nutrition Facts label on food packages as a guide.     Eat more fruits and vegetables. You could add vegetables to sandwiches or add fruit to cereal.   Drink water when you are thirsty. Limit soda, juice, and sports drinks.     Try to exercise most days. Aim for at least 2 hours of exercise each week.   Keep moving. Work in the garden or take your dog on a walk. Use the stairs instead of the elevator.     If you use tobacco or nicotine, try to quit. Ask your doctor about programs and medicines to help you quit.   Limit alcohol. Men should have no more than 2 drinks a day. Women should have no more than 1. For some people, no  alcohol is the best choice.   Follow-up care is a key part of your treatment and safety. Be sure to make and go to all appointments, and call your doctor if you are having problems. It's also a good idea to know your test results and keep a list of the medicines you take.  Where can you learn more?  Go to https://www.bennett.info/ and enter U807 to learn more about "A Healthy Lifestyle: Care Instructions."  Current as of: June 11, 2021               Content Version: 13.7   2006-2023 Healthwise, Incorporated.   Care instructions adapted under license by [employer] If you have questions about a medical condition or this instruction, always ask your healthcare professional. [place] any warranty or liability for your use of this information.         Patient Education        DASH Diet: Care Instructions  Your Care Instructions     The DASH diet is an eating plan that can help lower your blood pressure. DASH stands for Dietary Approaches to Stop Hypertension. Hypertension is high blood pressure.  The DASH diet focuses on eating foods that are high in calcium, potassium, and magnesium. These nutrients can lower blood pressure. The foods that are highest in these nutrients are fruits, vegetables, low-fat dairy products, nuts, seeds, and legumes. But taking calcium, potassium, and magnesium supplements instead of eating foods that are high in those nutrients does not have the same effect. The DASH diet also includes whole grains, fish, and poultry.  The DASH diet is one of several lifestyle changes your doctor may recommend to lower your high blood pressure. Your doctor may also want you to decrease the amount of sodium in your diet. Lowering sodium while following the DASH diet can lower blood pressure even further than just the DASH diet alone.  Follow-up care is a key part of your treatment and safety. Be sure to make and go to all appointments, and call your doctor if you are  having problems. It's also a good idea to know your test results and keep a list of the medicines you take.  How can you care for yourself at home?  Following the DASH diet  Eat 4 to 5 servings of fruit each day. A serving is 1 medium-sized piece of fruit,  cup chopped or canned fruit, 1/4 cup dried fruit, or 4 ounces ( cup) of fruit juice. Choose fruit more often than fruit juice.  Eat 4 to 5 servings  of vegetables each day. A serving is 1 cup of lettuce or raw leafy vegetables,  cup of chopped or cooked vegetables, or 4 ounces ( cup) of vegetable juice. Choose vegetables more often than vegetable juice.  Get 2 to 3 servings of low-fat and fat-free dairy each day. A serving is 8 ounces of milk, 1 cup of yogurt, or 1  ounces of cheese.  Eat 6 to 8 servings of grains each day. A serving is 1 slice of bread, 1 ounce of dry cereal, or  cup of cooked rice, pasta, or cooked cereal. Try to choose whole-grain products as much as possible.  Limit lean meat, poultry, and fish to 2 servings each day. A serving is 3 ounces, about the size of a deck of cards.  Eat 4 to 5 servings of nuts, seeds, and legumes (cooked dried beans, lentils, and split peas) each week. A serving is 1/3 cup of nuts, 2 tablespoons of seeds, or  cup of cooked beans or peas.  Limit fats and oils to 2 to 3 servings each day. A serving is 1 teaspoon of vegetable oil or 2 tablespoons of salad dressing.  Limit sweets and added sugars to 5 servings or less a week. A serving is 1 tablespoon jelly or jam,  cup sorbet, or 1 cup of lemonade.  Eat less than 2,300 milligrams (mg) of sodium a day. If you limit your sodium to 1,500 mg a day, you can lower your blood pressure even more.  Be aware that all of these are the suggested number of servings for people who eat 1,800 to 2,000 calories a day. Your recommended number of servings may be different if you need more or fewer calories.  Tips for success  Start small. Do not try to make dramatic changes to  your diet all at once. You might feel that you are missing out on your favorite foods and then be more likely to not follow the plan. Make small changes, and stick with them. Once those changes become habit, add a few more changes.  Try some of the following:  Make it a goal to eat a fruit or vegetable at every meal and at snacks. This will make it easy to get the recommended amount of fruits and vegetables each day.  Try yogurt topped with fruit and nuts for a snack or healthy dessert.  Add lettuce, tomato, cucumber, and onion to sandwiches.  Combine a ready-made pizza crust with low-fat mozzarella cheese and lots of vegetable toppings. Try using tomatoes, squash, spinach, broccoli, carrots, cauliflower, and onions.  Have a variety of cut-up vegetables with a low-fat dip as an appetizer instead of chips and dip.  Sprinkle sunflower seeds or chopped almonds over salads. Or try adding chopped walnuts or almonds to cooked vegetables.  Try some vegetarian meals using beans and peas. Add garbanzo or kidney beans to salads. Make burritos and tacos with mashed pinto beans or black beans.  Where can you learn more?  Go to https://www.bennett.info/ and enter H967 to learn more about "DASH Diet: Care Instructions."  Current as of: September 26, 2021               Content Version: 13.7   2006-2023 Healthwise, Incorporated.   Care instructions adapted under license by [facility]. If you have questions about a medical condition or this instruction, always ask your healthcare professional. [place] any warranty or liability for your use of this information.  Patient Education        Depression and Chronic Disease: Care Instructions  Your Care Instructions     A chronic disease is one that you have for a long time. Some chronic diseases can be controlled, but they usually cannot be cured. Depression is common in people with chronic diseases, but it often goes unnoticed.  Many people have  concerns about seeking treatment for a mental health problem. You may think it's a sign of weakness, or you don't want people to know about it. It's important to overcome these reasons for not seeking treatment. Treating depression or anxiety is good for your health.  Follow-up care is a key part of your treatment and safety. Be sure to make and go to all appointments, and call your doctor if you are having problems. It's also a good idea to know your test results and keep a list of the medicines you take.  How can you care for yourself at home?  Watch for symptoms of depression  The symptoms of depression are often subtle at first. You may think they are caused by your disease rather than depression. Or you may think it is normal to be depressed when you have a chronic disease.  If you are depressed you may:  Feel sad or hopeless.  Feel guilty or worthless.  Not enjoy the things you used to enjoy.  Feel hopeless, as though life is not worth living.  Have trouble thinking or remembering.  Have low energy, and you may not eat or sleep well.  Pull away from others.  Think often about death or killing yourself.  Get treatment  By treating your depression, you can feel more hopeful and have more energy. If you feel better, you may take better care of yourself, so your health may improve.  Talk to your doctor if you have any changes in mood during treatment for your disease.  Ask your doctor for help. Counseling, antidepressant medicine, or a combination of the two can help most people with depression. Often a combination works best. Counseling can also help you cope with having a chronic disease.  Where to get help 24 hours a day, 7 days a week   If you or someone you know talks about suicide, self-harm, a mental health crisis, a substance use crisis, or any other kind of emotional distress, get help right away. You can:  Call the Suicide and Crisis Lifeline at 62.  Call 1-800-273-TALK 9527387516).  Text HOME to  630-689-7951 to access the Crisis Text Line.  Consider saving these numbers in your phone.  Go to 988lifeline.org for more information or to chat online.  When should you call for help?   Call 911 anytime you think you may need emergency care. For example, call if:    You feel like hurting yourself or someone else.     Someone you know has depression and is about to attempt or is attempting suicide.   Where to get help 24 hours a day, 7 days a week   If you or someone you know talks about suicide, self-harm, a mental health crisis, a substance use crisis, or any other kind of emotional distress, get help right away. You can:    Call the Suicide and Crisis Lifeline at 70.     Call 1-800-273-TALK (579)425-9471).     Text HOME to (262) 102-1691 to access the Crisis Text Line.   Consider saving these numbers in your phone.  Go to 988lifeline.org  for more information or to chat online.  Call your doctor now or seek immediate medical care if:    You hear voices.     Someone you know has depression and:  Starts to give away possessions.  Uses illegal drugs or drinks alcohol heavily.  Talks or writes about death, including writing suicide notes or talking about guns, knives, or pills.  Starts to spend a lot of time alone.  Acts very aggressively or suddenly appears calm.   Watch closely for changes in your health, and be sure to contact your doctor if:    You do not get better as expected.   Where can you learn more?  Go to https://www.bennett.info/ and enter A548 to learn more about "Depression and Chronic Disease: Care Instructions."  Current as of: May 17, 2021               Content Version: 13.7   2006-2023 Healthwise, Incorporated.   Care instructions adapted under license by [facility]. If you have questions about a medical condition or this instruction, always ask your healthcare professional. [place] any warranty or liability for your use of this information.

## 2022-02-22 LAB — CBC
Hematocrit: 36.1 % (ref 35.0–45.0)
Hemoglobin: 12.2 g/dL (ref 12.0–16.0)
MCH: 35.3 PG — ABNORMAL HIGH (ref 24.0–34.0)
MCHC: 33.8 g/dL (ref 31.0–37.0)
MCV: 104.3 FL — ABNORMAL HIGH (ref 78.0–100.0)
MPV: 13.5 FL — ABNORMAL HIGH (ref 9.2–11.8)
Nucleated RBCs: 0 PER 100 WBC
Platelets: 48 10*3/uL — ABNORMAL LOW (ref 135–420)
RBC: 3.46 M/uL — ABNORMAL LOW (ref 4.20–5.30)
RDW: 14.3 % (ref 11.6–14.5)
WBC: 3.5 10*3/uL — ABNORMAL LOW (ref 4.6–13.2)
nRBC: 0 10*3/uL (ref 0.00–0.01)

## 2022-02-22 NOTE — Progress Notes (Signed)
Advance Care Planning   Ambulatory ACP Specialist Patient Outreach    Date:  02/22/2022    ACP Specialist:  Elio Forget    Outreach call to patient in follow-up to ACP Specialist referral from:Henry Sabra Heck, DO    [x]  PCP  []  Provider   []  Ambulatory Care Management []  Other     For:                  [x]  Advance Directive Assistance              []  Complete Portable DNR order              []  Complete POST/POLST/MOST              []  Code Status Discussion             []  Discuss Goals of Care             []  Early ACP Decision-Making              []  Other (Specify)    Date Referral Received: 02/21/22    Next Step:   []  ACP scheduled conversation  [x]  Outreach again in one week               []  Email / Mail ACP Info Sheets  []  Email / Mail Advance Directive   []  Closing referral.  Routing closure to referring provider/staff and to Lexicographer.    []  Closure letter mailed to patient with invitation to contact ACP Specialist if / when ready.   []  Other (Specify here):       [x]  At this time, Healthcare Decision Maker Is:  Advance Care Planning   Healthcare Decision Maker:    Primary Decision Maker: Stann Mainland - Niece/Nephew [phone]        []  Primary agent named in scanned advance directive.    [x]  Legal Next of Kin.     []  Unable to determine legal decision maker at this time.    Outreaches:         [x]  1st -  Date:  02/22/22               Intervention:  []  Spoke with Patient   [x]  Left Voice mail []  Email / Mail    []  MyChart  []  Other (Specify) :     Outcomes:  Outreach phone call to the patient.  Unable to reach, left voicemail message with call back information.  Will attempt to follow up in one week.           []  2nd -  Date:                 Intervention:  []  Spoke with Patient  []  Left Voice mail []  Email / Mail    []  MyChart  []  Other (Specify) :              Outcomes:                []  3rd -  Date:                Intervention:  []  Spoke with Patient   []  Left Voice mail []  Email  / Mail    []  MyChart  []  Other (Specify) :       Outcomes:           []   Additional Outreach -  Date:     (  Specify Dates & special circumstances):    Outcomes:         Thank you for this referral.

## 2022-02-23 LAB — HCV BY PCR QN REFLEX

## 2022-02-25 NOTE — Telephone Encounter (Signed)
Patient's niece called states since staring medication on Thursday pt has been extremely lethargic and nauseated, now experiencing pain down her entire right side

## 2022-02-26 NOTE — Telephone Encounter (Addendum)
Spoke with the patient. States she started experiencing the following symptoms since starting medication: Abdominal pain, worsening fatigue and nausea. Patient comments she stopped lexapro after 1 dose and still experiencing symptoms. Comments she needs her liver checked. "I need to make sure the medication hasnt affected my liver". "I am having a lot of pain in my liver that was not there before." Patient will be seen at urgent care due to worsening symptoms and availability.

## 2022-02-28 ENCOUNTER — Ambulatory Visit
Payer: MEDICARE | Attending: Student in an Organized Health Care Education/Training Program | Primary: Student in an Organized Health Care Education/Training Program

## 2022-02-28 LAB — HCV BY PCR QN REFLEX: Hep C Qnt: NOT DETECTED IU/mL

## 2022-02-28 LAB — HEPATITIS C AB, RFLX TO QT BY PCR: Hepatitis C Ab: REACTIVE s/co ratio — AB

## 2022-03-05 ENCOUNTER — Ambulatory Visit
Payer: MEDICARE | Attending: Student in an Organized Health Care Education/Training Program | Primary: Student in an Organized Health Care Education/Training Program

## 2022-03-05 ENCOUNTER — Telehealth
Admit: 2022-03-05 | Discharge: 2022-03-05 | Payer: MEDICARE | Attending: Student in an Organized Health Care Education/Training Program | Primary: Student in an Organized Health Care Education/Training Program

## 2022-03-05 DIAGNOSIS — M5136 Other intervertebral disc degeneration, lumbar region: Secondary | ICD-10-CM

## 2022-03-05 DIAGNOSIS — K7469 Other cirrhosis of liver: Secondary | ICD-10-CM

## 2022-03-05 MED ORDER — OXYCODONE HCL 10 MG PO TABS
10 MG | ORAL_TABLET | Freq: Three times a day (TID) | ORAL | 0 refills | Status: AC | PRN
Start: 2022-03-05 — End: 2022-04-04

## 2022-03-05 MED ORDER — XIFAXAN 550 MG PO TABS
550 MG | ORAL_TABLET | Freq: Two times a day (BID) | ORAL | 3 refills | Status: AC
Start: 2022-03-05 — End: 2022-10-09

## 2022-03-05 NOTE — Telephone Encounter (Signed)
Left message for patient to return call to the office.

## 2022-03-05 NOTE — Progress Notes (Unsigned)
Chief Complaint   Patient presents with    Back Pain     Chronic lower back pain. Requesting oxycodone and xifaxan          1. "Have you been to the ER, urgent care clinic since your last visit?  Hospitalized since your last visit?" No    2. "Have you seen or consulted any other health care providers outside of the [facility] since your last visit?" No     3. For patients aged 72-75: Has the patient had a colonoscopy / FIT/ Cologuard? NA - based on age

## 2022-03-05 NOTE — Telephone Encounter (Signed)
Patient calling to speak with nurse about pain management referral.

## 2022-03-06 NOTE — Progress Notes (Signed)
Brianna Velazquez (DOB:  September 24, 1950) is a Established patient, presenting virtually for evaluation of the following:    Assessment & Plan   Below is the assessment and plan developed based on review of pertinent history, physical exam, labs, studies, and medications.  1. Degenerative disc disease, lumbar  -     oxyCODONE HCl (OXY-IR) 10 MG immediate release tablet; Take 1 tablet by mouth every 8 hours as needed for Pain for up to 30 days. Intended supply: 30 days Max Daily Amount: 30 mg, Disp-90 tablet, R-0Normal  2. Other cirrhosis of liver (HCC)  -     XIFAXAN 550 MG tablet; Take 1 tablet by mouth 2 times daily, Disp-42 tablet, R-3, DAWNormal  -     Amb External Referral To Gastroenterology    Return if symptoms worsen or fail to improve.       Subjective   Pleasant 71 year old female with a past medical history of chronic low back pain 2/2 degenerative disc disease and cirrhosis of the liver (per chart review: History of hepatitis C and alcohol abuse) previously following with GI in [place], presents today via telemedicine to discuss her chronic medical issues.      Patient was previously referred to pain management clinic 2/2 history of chronic opioid use.  Patient is waiting to establish care with a pain management clinic.  Patient states she will be traveling out of state 2/2 loss of a family member.  Patient states she is about to run out of her pain medications, and is asking for refill pending her establishing with pain management.    Back Pain  This is a chronic problem. The current episode started more than 1 year ago. The problem occurs constantly. The problem is unchanged. The pain is present in the lumbar spine. The pain is at a severity of 8/10. The pain is severe. The pain is The same all the time. Pertinent negatives include no bladder incontinence, bowel incontinence, numbness or paresthesias. Risk factors include sedentary lifestyle, lack of exercise and menopause. Treatments tried: Opioids.  The treatment provided moderate relief.   Review of Systems   Gastrointestinal:  Negative for bowel incontinence.   Genitourinary:  Negative for bladder incontinence.   Musculoskeletal:  Positive for back pain.   Neurological:  Negative for numbness and paresthesias.        Objective   Patient-Reported Vitals  No data recorded     Physical Exam  [INSTRUCTIONS:  "[x] " Indicates a positive item  "[] " Indicates a negative item  -- DELETE ALL ITEMS NOT EXAMINED]    Constitutional: [x]  Appears well-developed and well-nourished [x]  No apparent distress      []  Abnormal -     Mental status: [x]  Alert and awake  [x]  Oriented to person/place/time [x]  Able to follow commands    []  Abnormal -     Eyes:   EOM    [x]   Normal    []  Abnormal -   Sclera  [x]   Normal    []  Abnormal -          Discharge [x]   None visible   []  Abnormal -     HENT: [x]  Normocephalic, atraumatic  []  Abnormal -   [x]  Mouth/Throat: Mucous membranes are moist    External Ears [x]  Normal  []  Abnormal -    Neck: [x]  No visualized mass []  Abnormal -     Pulmonary/Chest: [x]  Respiratory effort normal   [x]  No visualized signs of difficulty breathing or respiratory  distress        []  Abnormal -      Musculoskeletal:   [x]  Normal gait with no signs of ataxia         [x]  Normal range of motion of neck        []  Abnormal -     Neurological:        [x]  No Facial Asymmetry (Cranial nerve 7 motor function) (limited exam due to video visit)          [x]  No gaze palsy        []  Abnormal -          Skin:        [x]  No significant exanthematous lesions or discoloration noted on facial skin         []  Abnormal -            Psychiatric:       [x]  Normal Affect []  Abnormal -        [x]  No Hallucinations    Other pertinent observable physical exam findings:-             , was evaluated through a synchronous (real-time) audio-video encounter. The patient (or guardian if applicable) is aware that this is a billable service, which includes applicable co-pays.  This Virtual Visit was conducted with patient's (and/or legal guardian's) consent. Patient identification was verified, and a caregiver was present when appropriate.   The patient was located at Other: In her car with her niece  Provider was located at Facility (Appt Dept): [address], [address]  [place],           -- , DO       I spent 30 minutes with the patient in face-to-face (via telemedicine) consultation, of which greater than 50% was spent in counseling and coordination of care as described above

## 2022-03-19 ENCOUNTER — Encounter

## 2022-03-19 NOTE — Telephone Encounter (Signed)
Patient says [facility] does not have the   Oxycodone in stock and is requesting if the script can be sent to     [facility]  [address]  [place] [address]   PHONE [phone]  FAX: [phone]    Berdie Ogren, R.Ph, Pharm.D  Pharmacy Manager

## 2022-03-19 NOTE — Telephone Encounter (Signed)
Verified with [facility] pharmacy. Oxycodone is on back order. Unsure when they will be able to fill for the patient.

## 2022-03-21 ENCOUNTER — Ambulatory Visit
Admit: 2022-03-21 | Payer: MEDICARE | Attending: Student in an Organized Health Care Education/Training Program | Primary: Student in an Organized Health Care Education/Training Program

## 2022-03-21 DIAGNOSIS — Z0000 Encounter for general adult medical examination without abnormal findings: Secondary | ICD-10-CM

## 2022-03-21 MED ORDER — ZOSTER VAC RECOMB ADJUVANTED 50 MCG/0.5ML IM SUSR
50 MCG/0.5ML | Freq: Once | INTRAMUSCULAR | 0 refills | Status: AC
Start: 2022-03-21 — End: 2022-03-21

## 2022-03-21 MED ORDER — ZOSTER VAC RECOMB ADJUVANTED 50 MCG/0.5ML IM SUSR
50 MCG/0.5ML | Freq: Once | INTRAMUSCULAR | 1 refills | Status: DC
Start: 2022-03-21 — End: 2022-03-21

## 2022-03-21 MED ORDER — TETANUS-DIPHTH-ACELL PERTUSSIS 5-2-15.5 LF-MCG/0.5 IM SUSP
Freq: Once | INTRAMUSCULAR | 0 refills | Status: DC
Start: 2022-03-21 — End: 2022-03-21

## 2022-03-21 MED ORDER — TETANUS-DIPHTH-ACELL PERTUSSIS 5-2-15.5 LF-MCG/0.5 IM SUSP
Freq: Once | INTRAMUSCULAR | 0 refills | Status: AC
Start: 2022-03-21 — End: 2022-03-21

## 2022-03-21 NOTE — Patient Instructions (Addendum)
Preventing Falls: Care Instructions    Talk to your doctor about the medicines you take. Ask if any of them increase the risk of falls and whether they can be changed or stopped.   Try to exercise regularly. It can help improve your strength and balance. This can help lower your risk of falling.     Practice fall safety and prevention.    Wear low-heeled shoes that fit well and give your feet good support. Talk to your doctor if you have foot problems that make this hard.  Carry a cellphone or wear a medical alert device that you can use to call for help.  Use stepladders instead of chairs to reach high objects. Don't climb if you're at risk for falls. Ask for help, if needed.  Wear the correct eyeglasses, if you need them.    Make your home safer.    Remove rugs, cords, clutter, and furniture from walkways.  Keep your house well lit. Use night-lights in hallways and bathrooms.  Install and use sturdy handrails on stairways.  Wear nonskid footwear, even inside. Don't walk barefoot or in socks without shoes.    Be safe outside.    Use handrails, curb cuts, and ramps whenever possible.  Keep your hands free by using a shoulder bag or backpack.  Try to walk in well-lit areas. Watch out for uneven ground, changes in pavement, and debris.  Be careful in the winter. Walk on the grass or gravel when sidewalks are slippery. Use de-icer on steps and walkways. Add non-slip devices to shoes.    Put grab bars and nonskid mats in your shower or tub and near the toilet. Try to use a shower chair or bath bench when bathing.   Get into a tub or shower by putting in your weaker leg first. Get out with your strong side first. Have a phone or medical alert device in the bathroom with you.   Where can you learn more?  Go to https://www.bennett.info/ and enter G117 to learn more about "Preventing Falls: Care Instructions."  Current as of: June 06, 2021               Content Version: 13.7   2006-2023 Healthwise,  Incorporated.   Care instructions adapted under license by [facility]. If you have questions about a medical condition or this instruction, always ask your healthcare professional. [place] any warranty or liability for your use of this information.           Learning About Mindfulness for Stress  What are mindfulness and stress?     Stress is your body's response to a hard situation. Your body can have a physical, emotional, or mental response. A lot of things can cause stress. You may feel stress when you go on a job interview, take a test, or run a race. This kind of short-term stress is normal and even useful. It can help you if you need to work hard or react quickly.  Stress also can last a long time. Long-term stress is caused by stressful situations or events. Examples of long-term stress include long-term health problems, ongoing problems at work, and conflicts in your family. Long-term stress can harm your health.  Mindfulness is a focus only on things happening in the present moment. It's a process of purposefully paying attention to and being aware of your surroundings, your emotions, your thoughts, and how your body feels. You are aware of these things, but you aren't judging  these experiences as "good" or "bad." Mindfulness can help you learn to calm your mind and body to help you cope with illness, pain, and stress.  How does mindfulness help to relieve stress?  Mindfulness can help quiet your mind and relax your body. Studies show that it can help some people sleep better, feel less anxious, and bring their blood pressure down. And it's been shown to help some people live and cope better with certain health problems like heart disease, depression, chronic pain, and cancer.  How do you practice mindfulness?  To be mindful is to pay attention, to be present, and to be accepting. Like any new skill or habit, being mindful can take practice.  When you're mindful, you do just  one thing and you pay close attention to that one thing. For example, you may sit quietly and notice your emotions or how your food tastes and smells.  When you're present, you focus on the things that are happening right now. You let go of your thoughts about the past and the future. When you dwell on the past or the future, you miss moments that can heal and strengthen you. You may miss moments like hearing a child laugh or seeing a friendly face when you think you're all alone.  When you're accepting, you don't judge the present moment. Instead you accept your thoughts and feelings as they come.  You can practice anytime, anywhere, and in any way you choose. You can practice in many ways. Here are a few ideas:  While doing your chores, like washing the dishes, let your mind focus on what's in your hand. What does the dish feel like? Is the water warm or cold?  Go outside and take a few deep breaths. What is the air like? Is it warm or cold?  When you can, take some time at the start of your day to sit alone and think.  Take a slow walk by yourself. Count your steps while you breathe in and out.  Try yoga breathing exercises, stretches, and poses to strengthen and relax your muscles.  At work, if you can, try to stop for a few moments each hour. Note how your body feels. Let yourself regroup and let your mind settle before you return to what you were doing.  If you struggle with anxiety or "worry thoughts," imagine your mind as a blue sky and your worry thoughts as clouds. Now imagine those worry thoughts floating across your mind's sky. Just let them pass by as you watch.  Follow-up care is a key part of your treatment and safety. Be sure to make and go to all appointments, and call your doctor if you are having problems. It's also a good idea to know your test results and keep a list of the medicines you take.  Where can you learn more?  Go to https://www.bennett.info/ and enter M676 to learn more  about "Learning About Mindfulness for Stress."  Current as of: May 17, 2021               Content Version: 13.7   2006-2023 Healthwise, Incorporated.   Care instructions adapted under license by [facility]. If you have questions about a medical condition or this instruction, always ask your healthcare professional. [place] any warranty or liability for your use of this information.           Learning About Emotional Support  When do you need emotional support?  You might find getting support from others helpful when you have a long-term health problem. Often people feel alone, confused, or scared when coping with an illness. But you aren't alone. Other people are going through the same thing you are and know how you feel.  Talking with others about your feelings can help you feel better.  Your family and friends can give you support. So can your doctor, a support group, or a church. If you have a support network, you will not feel as alone. You will learn new ways to deal with your situation, and you may try harder to overcome it.  Where you can get support  Family and friends: They can help you cope by giving you comfort and encouragement.  Counseling: Professional counseling can help you cope with situations that interfere with your life and cause stress. Counseling can help you understand and deal with your illness.  Your doctor: Find a doctor you trust and feel comfortable with. Be open and honest about your fears and concerns. Your doctor can help you get the right medical treatments, including counseling.  Spiritual or religious groups: They can provide comfort and may be able to help you find counseling or other social support services.  Social groups: They can help you meet new people and get involved in activities you enjoy.  Community support groups: In a support group, you can talk to others who have dealt with the same problems or illness as you. You can encourage one  another and learn ways to cope with tough emotions.  How can you find a support group?  Finding a support group that works for you may take time. There are many options. Some groups have a group leader who helps lead discussions or shares information. Others are less formal. Some meet in person, while others meet online.  Try using these resources to help you find the best support group for you.  Your doctor, health care team, or counselor.  People with the same health concern.  Your local church, mosque, synagogue, or other religious group.  A city, state, or national group that provides support for your health concern. Check your local [place] or community center for a list of these groups. Or look for information online.  Your local community, friends, and family.  Supportive relationships  A supportive relationship includes emotional support such as love, trust, and understanding, as well as advice and concrete help, such as help managing your time.  Reach out to others  Family and friends can help you. Ask them to:  Listen to you and give you encouragement. This can keep you from feeling hopeless or alone.  Help with small daily tasks or with bigger problems. A helping hand can keep you from feeling overwhelmed.  Help you manage a health problem. For example, ask them to go to doctor visits with you. Your loved ones can offer support by being involved in your medical care.  Respect your relationships  A good relationship is also a two-way street. You count on help from others, but they also count on you.  Know your friends' limits. You don't have to see or call your friends every day. If you are going through a rough patch, ask friends if you can contact them outside of the usual boundaries.  Don't always complain or talk about yourself. Know when it's time to stop talking and listen or just enjoy your friend's company.  Know that good friends can be a bad influence.  For example, if a friend encourages you to  drink when you know it will harm you, you may want to end the friendship.  Where can you learn more?  Go to https://www.bennett.info/ and enter G092 to learn more about "Learning About Emotional Support."  Current as of: May 17, 2021               Content Version: 13.7   2006-2023 Healthwise, Incorporated.   Care instructions adapted under license by [facility]. If you have questions about a medical condition or this instruction, always ask your healthcare professional. [place] any warranty or liability for your use of this information.           Chronic Pain: Care Instructions  Your Care Instructions     Chronic pain is pain that lasts a long time (months or even years) and may or may not have a clear cause. It is different from acute pain, which usually does have a clear cause--like an injury or illness--and gets better over time. Chronic pain:  Lasts over time but may vary from day to day.  Does not go away despite efforts to end it.  May disrupt your sleep and lead to fatigue.  May cause depression or anxiety.  May make your muscles tense, causing more pain.  Can disrupt your work, hobbies, home life, and relationships with friends and family.  Chronic pain is a very real condition. It is not just in your head. Treatment can help and usually includes several methods used together, such as medicines, physical therapy, exercise, and other treatments. Learning how to relax and changing negative thought patterns can also help you cope.  Chronic pain is complex. Taking an active role in your treatment will help you better manage your pain. Tell your doctor if you have trouble dealing with your pain. You may have to try several things before you find what works best for you.  Follow-up care is a key part of your treatment and safety. Be sure to make and go to all appointments, and call your doctor if you are having problems. It's also a good idea to know your test results  and keep a list of the medicines you take.  How can you care for yourself at home?  Pace yourself. Break up large jobs into smaller tasks. Save harder tasks for days when you have less pain, or go back and forth between hard tasks and easier ones. Take rest breaks.  Relax, and reduce stress. Relaxation techniques such as deep breathing or meditation can help.  Keep moving. Gentle, daily exercise can help reduce pain over the long run. Try low- or no-impact exercises such as walking, swimming, and stationary biking. Do stretches to stay flexible.  Try heat, cold packs, and massage.  Get enough sleep. Chronic pain can make you tired and drain your energy. Talk with your doctor if you have trouble sleeping because of pain.  Think positive. Your thoughts can affect your pain level. Do things that you enjoy to distract yourself when you have pain instead of focusing on the pain. See a movie, read a book, listen to music, or spend time with a friend.  If you think you are depressed, talk to your doctor about treatment.  Keep a daily pain diary. Record how your moods, thoughts, sleep patterns, activities, and medicine affect your pain. You may find that your pain is worse during or after certain activities or when you are feeling a certain emotion.  Having a record of your pain can help you and your doctor find the best ways to treat your pain.  Take pain medicines exactly as directed.  If the doctor gave you a prescription medicine for pain, take it as prescribed.  If you are not taking a prescription pain medicine, ask your doctor if you can take an over-the-counter medicine.  Reducing constipation caused by pain medicine  Talk to your doctor about a laxative. If a laxative doesn't work, your doctor may suggest a prescription medicine.  Include fruits, vegetables, beans, and whole grains in your diet each day. These foods are high in fiber.  If your doctor recommends it, get more exercise. Walking is a good choice. Bit by  bit, increase the amount you walk every day. Try for at least 30 minutes on most days of the week.  Schedule time each day for a bowel movement. A daily routine may help. Take your time and do not strain when having a bowel movement.  When should you call for help?   Call your doctor now or seek immediate medical care if:    Your pain gets worse or is out of control.     You feel down or blue, or you do not enjoy things like you once did. You may be depressed, which is common in people with chronic pain. Depression can be treated.     You have vomiting or cramps for more than 2 hours.   Watch closely for changes in your health, and be sure to contact your doctor if:    You cannot sleep because of pain.     You are very worried or anxious about your pain.     You have trouble taking your pain medicine.     You have any concerns about your pain medicine.     You have trouble with bowel movements, such as:  No bowel movement in 3 days.  Blood in the anal area, in your stool, or on the toilet paper.  Diarrhea for more than 24 hours.   Where can you learn more?  Go to https://www.bennett.info/ and enter N004 to learn more about "Chronic Pain: Care Instructions."  Current as of: June 06, 2021               Content Version: 13.7   2006-2023 Healthwise, Incorporated.   Care instructions adapted under license by [facility]. If you have questions about a medical condition or this instruction, always ask your healthcare professional. [place] any warranty or liability for your use of this information.           Fatigue: Care Instructions  Your Care Instructions     Fatigue is a feeling of tiredness, exhaustion, or lack of energy. You may feel fatigue because of too much or not enough activity. It can also come from stress, lack of sleep, boredom, and poor diet. Many medical problems, such as viral infections, can cause fatigue. Emotional problems, especially depression, are often the  cause of fatigue.  Fatigue is most often a symptom of another problem. Treatment for fatigue depends on the cause. For example, if you have fatigue because you have a certain health problem, treating this problem also treats your fatigue. If depression or anxiety is the cause, treatment may help.  Follow-up care is a key part of your treatment and safety. Be sure to make and go to all appointments, and call your doctor if you are having problems. It's also  a good idea to know your test results and keep a list of the medicines you take.  How can you care for yourself at home?  Get regular exercise. But don't overdo it. Go back and forth between rest and exercise.  Get plenty of rest.  Eat a healthy diet. Do not skip meals, especially breakfast.  Reduce your use of caffeine, tobacco, and alcohol. Caffeine is most often found in coffee, tea, cola drinks, and chocolate.  Limit medicines that can cause fatigue. This includes tranquilizers and cold and allergy medicines.  When should you call for help?  Watch closely for changes in your health, and be sure to contact your doctor if:    You have new symptoms such as fever or a rash.     Your fatigue gets worse.     You have been feeling down, depressed, or hopeless. Or you may have lost interest in things that you usually enjoy.     You are not getting better as expected.   Where can you learn more?  Go to https://www.bennett.info/ and enter W864 to learn more about "Fatigue: Care Instructions."  Current as of: May 17, 2021               Content Version: 13.7   2006-2023 Healthwise, Incorporated.   Care instructions adapted under license by [facility]. If you have questions about a medical condition or this instruction, always ask your healthcare professional. [place] any warranty or liability for your use of this information.           Learning About Emotional Support  When do you need emotional support?     You might find  getting support from others helpful when you have a long-term health problem. Often people feel alone, confused, or scared when coping with an illness. But you aren't alone. Other people are going through the same thing you are and know how you feel.  Talking with others about your feelings can help you feel better.  Your family and friends can give you support. So can your doctor, a support group, or a church. If you have a support network, you will not feel as alone. You will learn new ways to deal with your situation, and you may try harder to overcome it.  Where you can get support  Family and friends: They can help you cope by giving you comfort and encouragement.  Counseling: Professional counseling can help you cope with situations that interfere with your life and cause stress. Counseling can help you understand and deal with your illness.  Your doctor: Find a doctor you trust and feel comfortable with. Be open and honest about your fears and concerns. Your doctor can help you get the right medical treatments, including counseling.  Spiritual or religious groups: They can provide comfort and may be able to help you find counseling or other social support services.  Social groups: They can help you meet new people and get involved in activities you enjoy.  Community support groups: In a support group, you can talk to others who have dealt with the same problems or illness as you. You can encourage one another and learn ways to cope with tough emotions.  How can you find a support group?  Finding a support group that works for you may take time. There are many options. Some groups have a group leader who helps lead discussions or shares information. Others are less formal. Some meet in person, while  others meet online.  Try using these resources to help you find the best support group for you.  Your doctor, health care team, or counselor.  People with the same health concern.  Your local church, mosque,  synagogue, or other religious group.  A city, state, or national group that provides support for your health concern. Check your local [place] or community center for a list of these groups. Or look for information online.  Your local community, friends, and family.  Supportive relationships  A supportive relationship includes emotional support such as love, trust, and understanding, as well as advice and concrete help, such as help managing your time.  Reach out to others  Family and friends can help you. Ask them to:  Listen to you and give you encouragement. This can keep you from feeling hopeless or alone.  Help with small daily tasks or with bigger problems. A helping hand can keep you from feeling overwhelmed.  Help you manage a health problem. For example, ask them to go to doctor visits with you. Your loved ones can offer support by being involved in your medical care.  Respect your relationships  A good relationship is also a two-way street. You count on help from others, but they also count on you.  Know your friends' limits. You don't have to see or call your friends every day. If you are going through a rough patch, ask friends if you can contact them outside of the usual boundaries.  Don't always complain or talk about yourself. Know when it's time to stop talking and listen or just enjoy your friend's company.  Know that good friends can be a bad influence. For example, if a friend encourages you to drink when you know it will harm you, you may want to end the friendship.  Where can you learn more?  Go to https://www.bennett.info/ and enter G092 to learn more about "Learning About Emotional Support."  Current as of: May 17, 2021               Content Version: 13.7   2006-2023 Healthwise, Incorporated.   Care instructions adapted under license by [facility]. If you have questions about a medical condition or this instruction, always ask your healthcare professional. [place] any warranty or liability for your use of this information.           Learning About Stress  What is stress?     Stress is your body's response to a hard situation. Your body can have a physical, emotional, or mental response. Stress is a fact of life for most people, and it affects everyone differently. What causes stress for you may not be stressful for someone else.  A lot of things can cause stress. You may feel stress when you go on a job interview, take a test, or run a race. This kind of short-term stress is normal and even useful. It can help you if you need to work hard or react quickly. For example, stress can help you finish an important job on time.  Long-term stress is caused by ongoing stressful situations or events. Examples of long-term stress include long-term health problems, ongoing problems at work, or conflicts in your family. Long-term stress can harm your health.  How does stress affect your health?  When you are stressed, your body responds as though you are in danger. It makes hormones that speed up your heart, make you breathe faster, and give you a burst  of energy. This is called the fight-or-flight stress response. If the stress is over quickly, your body goes back to normal and no harm is done.  But if stress happens too often or lasts too long, it can have bad effects. Long-term stress can make you more likely to get sick, and it can make symptoms of some diseases worse. If you tense up when you are stressed, you may develop neck, shoulder, or low back pain. Stress is linked to high blood pressure and heart disease.  Stress also harms your emotional health. It can make you moody, tense, or depressed. Your relationships may suffer, and you may not do well at work or school.  What can you do to manage stress?  You can try these things to help manage stress:   Do something active. Exercise or activity can help reduce stress. Walking is a great way to get started.  Even everyday activities such as housecleaning or yard work can help.  Try yoga or tai chi. These techniques combine exercise and meditation. You may need some training at first to learn them.  Do something you enjoy. For example, listen to music or go to a movie. Practice your hobby or do volunteer work.  Meditate. This can help you relax, because you are not worrying about what happened before or what may happen in the future.  Do guided imagery. Imagine yourself in any setting that helps you feel calm. You can use online videos, books, or a teacher to guide you.  Do breathing exercises. For example:  From a standing position, bend forward from the waist with your knees slightly bent. Let your arms dangle close to the floor.  Breathe in slowly and deeply as you return to a standing position. Roll up slowly and lift your head last.  Hold your breath for just a few seconds in the standing position.  Breathe out slowly and bend forward from the waist.  Let your feelings out. Talk, laugh, cry, and express anger when you need to. Talking with supportive friends or family, a Social worker, or a faith leader about your feelings is a healthy way to relieve stress. Avoid discussing your feelings with people who make you feel worse.  Write. It may help to write about things that are bothering you. This helps you find out how much stress you feel and what is causing it. When you know this, you can find better ways to cope.  What can you do to prevent stress?  Manage your time. This helps you find time to do the things you want and need to do.  Get enough sleep. Your body recovers from the stresses of the day while you are sleeping.  Get support. Your family, friends, and community can make a difference in how you experience stress.  Limit your news feed. Avoid or limit time on social media or news that may make you feel stressed.  Do something active. Exercise or activity can help reduce stress. Walking is a great way to get  started.  Where can you learn more?  Go to https://www.bennett.info/ and enter N032 to learn more about "Learning About Stress."  Current as of: September 24, 2021               Content Version: 13.7   2006-2023 Healthwise, Incorporated.   Care instructions adapted under license by [facility]. If you have questions about a medical condition or this instruction, always ask your healthcare professional. [place] any  warranty or liability for your use of this information.           Learning About Managing Anger  What causes anger?  Many things can cause anger: Stress at work or at home. Social situations that make you angry. A response to everyday events.  Anger signals your body to prepare for a fight. This reaction is often called "fight or flight." When you get angry, adrenaline and other hormones are released into your blood. Then your blood pressure goes up, your heart beats faster, and you breathe faster.  When you express anger in a healthy way, it can inspire change and make you productive. But if you don't have the skills to express anger in a healthy way, anger can build up. You may hurt others--and yourself--emotionally and even physically. Violent behavior often starts with verbal threats or fairly minor incidents. But over time, it can involve physical harm. It can include physical, verbal, or sexual abuse of an intimate partner (domestic violence), a child (child abuse), or an older adult (elder abuse).  It can also make you sick. Anger and constant hostility keep your blood pressure high. They increase your chances of having another health problem, such as depression, a heart attack, or a stroke. Some people with post-traumatic stress disorder (PTSD) feel angry and on alert all the time.  It may feel like there are no other ways to react when you are angry. But when you learn to work with anger in appropriate and healthy ways, your anger no longer controls you.  How  can you manage your anger?  The first step to managing anger is to be more aware of it. Note the thoughts, feelings, and emotions that you have when you get angry. Practice noticing these signs of anger when you are calm. If you are more aware of the signs of anger, you can take steps to manage it. Here are a few tips:  Think before you act. Take time to stop and cool down when you feel yourself getting angry. Count to 10 while you take slow, steady breaths. Practice some other form of mental relaxation.  Learn the feelings that lead to angry outbursts. Anger and hostility may be a symptom of unhappy feelings or depression about your job, your relationship, or other aspects of your personal life.  Avoid situations that lead to angry outbursts. If standing in line bothers you, do errands at less busy times.  Express anger in a healthy way. You might:  Go for a short walk or jog.  Draw, paint, or listen to music to release the anger.  Write in a daily journal.  Use "I" statements, not "you" statements, to discuss your anger. Say "I don't feel valued when my needs are not being met" instead of "You make me mad when you are so inconsiderate."  Take care of yourself.  Exercise regularly.  Eat a variety of healthy foods. Don't skip meals.  Try to get 8 hours of sleep each night.  Limit your use of alcohol, and don't use drugs.  Practice yoga, meditation, or tai chi to relax.  Explore other resources that may be available through your job or your community.  Contact your human resources department at work. You might be able to get services through an employee assistance program.  Contact your local hospital, mental health facility, or health department. Ask what types of programs or support groups are available in your area.  Do not keep guns in your home.  If you must have guns in your home, unload them and lock them up. Lock ammunition in a separate place. Keep guns away from children.  Where can you find help?  If anger  or stress starts to harm your work or personal relationships, you might seek help. You can learn ways to manage your feelings and actions.  Talk to someone you trust, or find a Social worker.  There are groups in your area that can connect you with people to talk to.  Behavioral Health Treatment Services Locator. This service from the national Substance Abuse and [place] can help you find local counselors. Search online at [employer].SamedayNews.com.cy or call 1-800-662-HELP (4357), or TDD 220-619-8101.  Parents Anonymous. Self-help groups that serve parents under stress, as well as children who have been abused, are available throughout the [place], [place], and [place]. To find a group in your area, search online or in your phone book under Parents Anonymous or call 623-380-4155.  Where can you learn more?  Go to https://www.bennett.info/ and enter Z357 to learn more about "Learning About Managing Anger."  Current as of: May 17, 2021               Content Version: 13.7   2006-2023 Healthwise, Incorporated.   Care instructions adapted under license by [facility]. If you have questions about a medical condition or this instruction, always ask your healthcare professional. [place] any warranty or liability for your use of this information.           Learning About Being Active as an Older Adult  Why is being active important as you get older?     Being active is one of the best things you can do for your health. And it's never too late to start. Being active--or getting active, if you aren't already--has definite benefits. It can:  Give you more energy,  Keep your mind sharp.  Improve balance to reduce your risk of falls.  Help you manage chronic illness with fewer medicines.  No matter how old you are, how fit you are, or what health problems you have, there is a form of activity that will work for you. And the more physical activity you can  do, the better your overall health will be.  What kinds of activity can help you stay healthy?  Being more active will make your daily activities easier. Physical activity includes planned exercise and things you do in daily life. There are four types of activity:  Aerobic.  Doing aerobic activity makes your heart and lungs strong.  Includes walking, dancing, and gardening.  Aim for at least 2 hours spread throughout the week.  It improves your energy and can help you sleep better.  Muscle-strengthening.  This type of activity can help maintain muscle and strengthen bones.  Includes climbing stairs, using resistance bands, and lifting or carrying heavy loads.  Aim for at least twice a week.  It can help protect the knees and other joints.  Stretching.  Stretching gives you better range of motion in joints and muscles.  Includes upper arm stretches, calf stretches, and gentle yoga.  Aim for at least twice a week, preferably after your muscles are warmed up from other activities.  It can help you function better in daily life.  Balancing.  This helps you stay coordinated and have good posture.  Includes heel-to-toe walking, tai chi, and certain types of yoga.  Aim for at least 3 days a  week.  It can reduce your risk of falling.  Even if you have a hard time meeting the recommendations, it's better to be more active than less active. All activity done in each category counts toward your weekly total. You'd be surprised how daily things like carrying groceries, keeping up with grandchildren, and taking the stairs can add up.  What keeps you from being active?  If you've had a hard time being more active, you're not alone. Maybe you remember being able to do more. Or maybe you've never thought of yourself as being active. It's frustrating when you can't do the things you want. Being more active can help. What's holding you back?  Getting started.  Have a goal, but break it into easy tasks. Small steps build into big  accomplishments.  Staying motivated.  If you feel like skipping your activity, remember your goal. Maybe you want to move better and stay independent. Every activity gets you one step closer.  Not feeling your best.  Start with 5 minutes of an activity you enjoy. Prove to yourself you can do it. As you get comfortable, increase your time.  You may not be where you want to be. But you're in the process of getting there. Everyone starts somewhere.  How can you find safe ways to stay active?  Talk with your doctor about any physical challenges you're facing. Make a plan with your doctor if you have a health problem or aren't sure how to get started with activity.  If you're already active, ask your doctor if there is anything you should change to stay safe as your body and health change.  If you tend to feel dizzy after you take medicine, avoid activity at that time. Try being active before you take your medicine. This will reduce your risk of falls.  If you plan to be active at home, make sure to clear your space before you get started. Remove things like TV cords, coffee tables, and throw rugs. It's safest to have plenty of space to move freely.  The key to getting more active is to take it slow and steady. Try to improve only a little bit at a time. Pick just one area to improve on at first. And if an activity hurts, stop and talk to your doctor.  Where can you learn more?  Go to https://www.bennett.info/ and enter P600 to learn more about "Learning About Being Active as an Older Adult."  Current as of: May 07, 2021               Content Version: 13.7   2006-2023 Healthwise, Incorporated.   Care instructions adapted under license by [facility]. If you have questions about a medical condition or this instruction, always ask your healthcare professional. [place] any warranty or liability for your use of this information.           Learning About Dental Care for Older  Adults  Dental care for older adults: Overview  Dental care for older people is much the same as for younger adults. But older adults do have concerns that younger adults do not. Older adults may have problems with gum disease and decay on the roots of their teeth. They may need missing teeth replaced or broken fillings fixed. Or they may have dentures that need to be cared for. Some older adults may have trouble holding a toothbrush.  You can help remind the person you are caring for to brush and  floss their teeth or to clean their dentures. In some cases, you may need to do the brushing and other dental care tasks. People who have trouble using their hands or who have dementia may need this extra help.  How can you help with dental care?  Normal dental care  To keep the teeth and gums healthy:  Brush the teeth with fluoride toothpaste twice a day--in the morning and at night--and floss at least once a day. Plaque can quickly build up on the teeth of older adults.  Watch for the signs of gum disease. These signs include gums that bleed after brushing or after eating hard foods, such as apples.  See a dentist regularly. Many experts recommend checkups every 6 months.  Keep the dentist up to date on any new medications the person is taking.  Encourage a balanced diet that includes whole grains, vegetables, and fruits, and that is low in saturated fat and sodium.  Encourage the person you're caring for not to use tobacco products. They can affect dental and general health.  Many older adults have a fixed income and feel that they can't afford dental care. But most towns and cities have programs in which dentists help older adults by lowering fees. Contact your area's public health offices or social services for information about dental care in your area.  Using a toothbrush  Older adults with arthritis sometimes have trouble brushing their teeth because they can't easily hold the toothbrush. Their hands and fingers  may be stiff, painful, or weak. If this is the case, you can:  Offer an IT trainer toothbrush.  Enlarge the handle of a non-electric toothbrush by wrapping a sponge, an elastic bandage, or adhesive tape around it.  Push the toothbrush handle through a ball made of rubber or soft foam.  Make the handle longer and thicker by taping Popsicle sticks or tongue depressors to it.  You may also be able to buy special toothbrushes, toothpaste dispensers, and floss holders.  Your doctor may recommend a soft-bristle toothbrush if the person you care for bleeds easily. Bleeding can happen because of a health problem or from certain medicines.  A toothpaste for sensitive teeth may help if the person you care for has sensitive teeth.  How do you brush and floss someone's teeth?  If the person you are caring for has a hard time cleaning their teeth on their own, you may need to brush and floss their teeth for them. It may be easiest to have the person sit and face away from you, and to sit or stand behind them. That way you can steady their head against your arm as you reach around to floss and brush their teeth. Choose a place that has good lighting and is comfortable for both of you.  Before you begin, gather your supplies. You will need gloves, floss, a toothbrush, and a container to hold water if you are not near a sink. Wash and dry your hands well and put on gloves. Start by flossing:  Gently work a piece of floss between each of the teeth toward the gums. A plastic flossing tool may make this easier, and they are available at most drugstores.  Curve the floss around each tooth into a U-shape and gently slide it under the gum line.  Move the floss firmly up and down several times to scrape off the plaque.  After you've finished flossing, throw away the used floss and begin brushing:  Wet the brush and  apply toothpaste.  Place the brush at a 45-degree angle where the teeth meet the gums. Press firmly, and move the brush in  small circles over the surface of the teeth.  Be careful not to brush too hard. Vigorous brushing can make the gums pull away from the teeth and can scratch the tooth enamel.  Brush all surfaces of the teeth, on the tongue side and on the cheek side. Pay special attention to the front teeth and all surfaces of the back teeth.  Brush chewing surfaces with short back-and-forth strokes.  After you've finished, help the person rinse the remaining toothpaste from their mouth.  Where can you learn more?  Go to https://www.bennett.info/ and enter F944 to learn more about "Tees Toh for Older Adults."  Current as of: June 11, 2021               Content Version: 13.7   2006-2023 Healthwise, Incorporated.   Care instructions adapted under license by [facility]. If you have questions about a medical condition or this instruction, always ask your healthcare professional. [place] any warranty or liability for your use of this information.           Learning About Activities of Daily Living  What are activities of daily living?     Activities of daily living (ADLs) are the basic self-care tasks you do every day. As you age, and if you have health problems, you may find that it's harder to do these things for yourself. That's when you may need some help.  Your doctor uses ADLs to measure how much help you need. Knowing what you can and can't do for yourself is an important first step to getting help. And when you have the help you need, you can stay as independent as possible.  Your doctor will want to know if you are able to do tasks such as:  Take a bath or shower without help.  Go to the bathroom by yourself.  Dress and undress without help.  Shave, comb your hair, and brush teeth on your own.  Get in and out of bed or a chair without help.  Feed yourself without help.  If you are having trouble doing basic self-care tasks, talk with your doctor. You may want to  bring a caregiver or family member who can help the doctor understand your needs and abilities.  How will a doctor assess your ADLs?  Asking about ADLs is part of a routine health checkup your doctor will likely do as you age. Your health check might be done in a doctor's office, in your home, or at a hospital. The goal is to find out if you are having any problems that could make your health problems worse or that make it unsafe for you to be on your own.  To measure your ADLs, your doctor will ask how hard it is for you to do routine tasks. He or she may also want to know if you have changed the way you do a task because of a health problem. He or she may watch how you:  Walk back and forth.  Keep your balance while you stand or walk.  Move from sitting to standing or from a bed to a chair.  Button or unbutton a Printmaker.  Remove and put on your shoes.  It's normal to feel a little worried or anxious if you find you can't do all the things you  used to be able to do. Talking with your doctor about ADLs isn't a test that you either pass or fail. It's just a way to get more information about your health and safety.  Follow-up care is a key part of your treatment and safety. Be sure to make and go to all appointments, and call your doctor if you are having problems. It's also a good idea to know your test results and keep a list of the medicines you take.  Current as of: September 24, 2021               Content Version: 13.7   2006-2023 Healthwise, Incorporated.   Care instructions adapted under license by [facility]. If you have questions about a medical condition or this instruction, always ask your healthcare professional. [place] any warranty or liability for your use of this information.           Advance Directives: Care Instructions  Overview  An advance directive is a legal way to state your wishes at the end of your life. It tells your family and your doctor what to do if  you can't say what you want.  There are two main types of advance directives. You can change them any time your wishes change.  Living will.  This form tells your family and your doctor your wishes about life support and other treatment. The form is also called a declaration.  Medical power of attorney.  This form lets you name a person to make treatment decisions for you when you can't speak for yourself. This person is called a health care agent (health care proxy, health care surrogate). The form is also called a durable power of attorney for health care.  If you do not have an advance directive, decisions about your medical care may be made by a family member, or by a doctor or a judge who doesn't know you.  It may help to think of an advance directive as a gift to the people who care for you. If you have one, they won't have to make tough decisions by themselves.  For more information, including forms for your state, see the [place] website (RebankingSpace.hu).  Follow-up care is a key part of your treatment and safety. Be sure to make and go to all appointments, and call your doctor if you are having problems. It's also a good idea to know your test results and keep a list of the medicines you take.  What should you include in an advance directive?  Many states have a unique advance directive form. (It may ask you to address specific issues.) Or you might use a universal form that's approved by many states.  If your form doesn't tell you what to address, it may be hard to know what to include in your advance directive. Use the questions below to help you get started.  Who do you want to make decisions about your medical care if you are not able to?  What life-support measures do you want if you have a serious illness that gets worse over time or can't be cured?  What are you most afraid of that might happen? (Maybe you're afraid of having pain, losing your independence, or  being kept alive by machines.)  Where would you prefer to die? (Your home? A hospital? A nursing home?)  Do you want to donate your organs when you die?  Do you want certain religious practices performed before you die?  When should you call for help?  Be sure to contact your doctor if you have any questions.  Where can you learn more?  Go to https://www.bennett.info/ and enter R264 to learn more about "Advance Directives: Care Instructions."  Current as of: October 22, 2021               Content Version: 13.7   2006-2023 Healthwise, Incorporated.   Care instructions adapted under license by [facility]. If you have questions about a medical condition or this instruction, always ask your healthcare professional. [place] any warranty or liability for your use of this information.           A Healthy Heart: Care Instructions  Your Care Instructions     Coronary artery disease, also called heart disease, occurs when a substance called plaque builds up in the vessels that supply oxygen-rich blood to your heart muscle. This can narrow the blood vessels and reduce blood flow. A heart attack happens when blood flow is completely blocked. A high-fat diet, smoking, and other factors increase the risk of heart disease.  Your doctor has found that you have a chance of having heart disease. You can do lots of things to keep your heart healthy. It may not be easy, but you can change your diet, exercise more, and quit smoking. These steps really work to lower your chance of heart disease.  Follow-up care is a key part of your treatment and safety. Be sure to make and go to all appointments, and call your doctor if you are having problems. It's also a good idea to know your test results and keep a list of the medicines you take.  How can you care for yourself at home?  Diet    Use less salt when you cook and eat. This helps lower your blood pressure. Taste food before salting. Add only a  little salt when you think you need it. With time, your taste buds will adjust to less salt.     Eat fewer snack items, fast foods, canned soups, and other high-salt, high-fat, processed foods.     Read food labels and try to avoid saturated and trans fats. They increase your risk of heart disease by raising cholesterol levels.     Limit the amount of solid fat-butter, margarine, and shortening-you eat. Use olive, peanut, or canola oil when you cook. Bake, broil, and steam foods instead of frying them.     Eat a variety of fruit and vegetables every day. Dark green, deep orange, red, or yellow fruits and vegetables are especially good for you. Examples include spinach, carrots, peaches, and berries.     Foods high in fiber can reduce your cholesterol and provide important vitamins and minerals. High-fiber foods include whole-grain cereals and breads, oatmeal, beans, brown rice, citrus fruits, and apples.     Eat lean proteins. Heart-healthy proteins include seafood, lean meats and poultry, eggs, beans, peas, nuts, seeds, and soy products.     Limit drinks and foods with added sugar. These include candy, desserts, and soda pop.   Lifestyle changes    If your doctor recommends it, get more exercise. Walking is a good choice. Bit by bit, increase the amount you walk every day. Try for at least 30 minutes on most days of the week. You also may want to swim, bike, or do other activities.     Do not smoke. If you need help quitting, talk to your doctor about stop-smoking  programs and medicines. These can increase your chances of quitting for good. Quitting smoking may be the most important step you can take to protect your heart. It is never too late to quit.     Limit alcohol to 2 drinks a day for men and 1 drink a day for women. Too much alcohol can cause health problems.     Manage other health problems such as diabetes, high blood pressure, and high cholesterol. If you think you may have a problem with alcohol or drug  use, talk to your doctor.   Medicines    Take your medicines exactly as prescribed. Call your doctor if you think you are having a problem with your medicine.     If your doctor recommends aspirin, take the amount directed each day. Make sure you take aspirin and not another kind of pain reliever, such as acetaminophen (Tylenol).   When should you call for help?   Call 911 if you have symptoms of a heart attack. These may include:    Chest pain or pressure, or a strange feeling in the chest.     Sweating.     Shortness of breath.     Pain, pressure, or a strange feeling in the back, neck, jaw, or upper belly or in one or both shoulders or arms.     Lightheadedness or sudden weakness.     A fast or irregular heartbeat.   After you call 911, the operator may tell you to chew 1 adult-strength or 2 to 4 low-dose aspirin. Wait for an ambulance. Do not try to drive yourself.  Watch closely for changes in your health, and be sure to contact your doctor if you have any problems.  Where can you learn more?  Go to https://www.bennett.info/ and enter F075 to learn more about "A Healthy Heart: Care Instructions."  Current as of: April 04, 2021               Content Version: 13.7   2006-2023 Healthwise, Incorporated.   Care instructions adapted under license by [facility]. If you have questions about a medical condition or this instruction, always ask your healthcare professional. [place] any warranty or liability for your use of this information.      Personalized Preventive Plan for Brianna Velazquez - 03/21/2022  Medicare offers a range of preventive health benefits. Some of the tests and screenings are paid in full while other may be subject to a deductible, co-insurance, and/or copay.    Some of these benefits include a comprehensive review of your medical history including lifestyle, illnesses that may run in your family, and various assessments and screenings as  appropriate.    After reviewing your medical record and screening and assessments performed today your provider may have ordered immunizations, labs, imaging, and/or referrals for you.  A list of these orders (if applicable) as well as your Preventive Care list are included within your After Visit Summary for your review.    Other Preventive Recommendations:    A preventive eye exam performed by an eye specialist is recommended every 1-2 years to screen for glaucoma; cataracts, macular degeneration, and other eye disorders.  A preventive dental visit is recommended every 6 months.  Try to get at least 150 minutes of exercise per week or 10,000 steps per day on a pedometer .  Order or download the FREE "Exercise & Physical Activity: Your Everyday Guide" from [employer]. Call 765-732-9957 or search  [employer] online.  You need 1200-1500 mg of calcium and 1000-2000 IU of vitamin D per day. It is possible to meet your calcium requirement with diet alone, but a vitamin D supplement is usually necessary to meet this goal.  When exposed to the sun, use a sunscreen that protects against both UVA and UVB radiation with an SPF of 30 or greater. Reapply every 2 to 3 hours or after sweating, drying off with a towel, or swimming.  Always wear a seat belt when traveling in a car. Always wear a helmet when riding a bicycle or motorcycle.Patient Education        Learning About High-Potassium Foods  What foods are high in potassium?     The foods you eat contain nutrients, such as vitamins and minerals. Potassium is a nutrient. Your body needs the right amount to stay healthy and work as it should. You can use the list below to help you make choices about which foods to eat.  The foods in this list have at least 200 milligrams (mg) of potassium per serving.  Fruits  Apricots (dried),  cup  Avocado,  fruit  Banana, 1 medium  Mango, 1 fruit  Nectarine, 1 fruit  Orange, 1 fruit  Prunes, 5  fruits  Raisins,  cup  Vegetables  Artichoke, 1 medium  Beets,  cup  Broccoli,  cup  Brussels sprouts,  cup  Potato with skin, 1 medium  Spinach,  cup  Sweet potato, 1 medium  Tomato sauce,  cup  Zucchini,  cup  Dairy and dairy alternatives  Milk, 1 cup  Soy milk, 1 cup  Yogurt, 6 oz  Meats and other protein foods  Beans (lima, navy, white),  cup  Beef, ground, 3 oz  Chicken, 3 oz  Fish (halibut, tuna, cod, snapper), 3 oz  Nuts (almonds, hazelnuts, [place], cashew, pistachios), 1 oz  Peanut butter, 2 Tbsp  Peanuts, 1 oz  [place], 3 oz  Seasonings  Salt substitutes  Work with your doctor to find out how much of this nutrient you need. Depending on your health, you may need more or less of it in your diet.  Where can you learn more?  Go to https://www.bennett.info/ and enter P450 to learn more about "Learning About High-Potassium Foods."  Current as of: September 26, 2021               Content Version: 13.7   2006-2023 Healthwise, Incorporated.   Care instructions adapted under license by [facility]. If you have questions about a medical condition or this instruction, always ask your healthcare professional. [place] any warranty or liability for your use of this information.

## 2022-03-21 NOTE — Progress Notes (Unsigned)
1. "Have you been to the ER, urgent care clinic since your last visit?  Hospitalized since your last visit?" No    2. "Have you seen or consulted any other health care providers outside of the [facility] since your last visit?" No     3. For patients aged 72-75: Has the patient had a colonoscopy / FIT/ Cologuard? Yes - no Care Gap present      If the patient is female:    4. For patients aged 40-74: Has the patient had a mammogram within the past 2 years? No      5. For patients aged 21-65: Has the patient had a pap smear? NA - based on age or sex

## 2022-03-21 NOTE — Progress Notes (Unsigned)
Medicare Annual Wellness Visit    Brianna Velazquez is here for Medicare AWV, Depression, and Hypertension    Assessment & Plan   Medicare annual wellness visit, subsequent  Recommendations for Preventive Services Due: see orders and patient instructions/AVS.  Recommended screening schedule for the next 5-10 years is provided to the patient in written form: see Patient Instructions/AVS.     No follow-ups on file.     Subjective   {OPTIONAL - WILL AUTO-DELETE IF NOT XBJY:[id number]}    Patient's complete Health Risk Assessment and screening values have been reviewed and are found in Flowsheets. The following problems were reviewed today and where indicated follow up appointments were made and/or referrals ordered.    Positive Risk Factor Screenings with Interventions:    Fall Risk:  Do you feel unsteady or are you worried about falling? : (!) yes  2 or more falls in past year?: no  Fall with injury in past year?: no     Interventions:    {Fall Interventions:201460083}    Cognitive:   Words recalled: 2 Words Recalled           Total Score Interpretation: Abnormal Mini-Cog      Interventions:  {Cogntive Interventions:201460083}    Depression:  PHQ-2 Score: 6  PHQ-9 Total Score: 13    Interpretation:   1-4 = minimal  5-9 = mild  10-14 = moderate  15-19 = moderately severe  20-27 = severe  Interventions:  {Depression Interventions:210200097}       {IMPORTANT! Click here to document Controlled Substance Monitoring and then refresh note (Optional):210980382}    Opioid Risk: (Low risk score <55) Opioid risk score: 27    Patient is low risk for opioid use disorder or overdose.  Last PDMP Loraine Leriche as Reviewed:  Review User Review Instant Review Result              Last Controlled Substance Monitoring Documentation    Flowsheet Row Telemedicine from 03/05/2022 in [facility]   Periodic Controlled Substance Monitoring Possible medication side effects, risk of tolerance/dependence & alternative treatments discussed.  filed at 03/05/2022 8657           Self-assessment of health:  In general, how would you say your health is?: (!) Poor    Interventions:  {Medicare AWV HRA Interventions:201460083}    General HRA Questions:  Select all that apply: (!) New or Increased Pain, New or Increased Fatigue, Loneliness, Social Isolation, Stress, Anger    Pain Interventions:  {Pain Interventions:201460090}    Fatigue Interventions:  {Fatigue Interventions:201460090}    Loneliness Interventions:  {Loneliness Interventions:201460090}    Social Isolation Interventions:  {Social Isolation QIONGEXBMWUXL:244010272}    Stress Interventions:  {Stress Interventions:201460090}    Anger Interventions:  {Anger Interventions:201460090}       Weight and Activity:  Physical Activity: Inactive   . Days of Exercise per Week: 0 days   . Minutes of Exercise per Session: 0 min     On average, how many days per week do you engage in moderate to strenuous exercise (like a brisk walk)?: 0 days  Have you lost any weight without trying in the past 3 months?: No  Body mass index is 24.59 kg/m.      Inactivity Interventions:  {Inactivity Interventions:201460115}      Dentist Screen:  Have you seen the dentist within the past year?: (!) No    Intervention:  {Dentist Interventions:201460101}       ADL's:   Patient reports needing  help with:  Select all that apply: (!) Transportation  Interventions:  {Medicare AWV HRA Interventions with see above/meds/referrals:201460090}    Advanced Directives:  Do you have a Living Will?: (!) No    Intervention:  {AWV ADVANCED DIRECTIVE:4101916090}    {OPTIONAL - only use if billing for counseling:[phone]}     {OPTIONAL- LDCT, CVD, STI Counseling Statements:[phone]}              Objective   Vitals:    03/21/22 1319   BP: 134/70   Site: Left Upper Arm   Position: Sitting   Cuff Size: Small Adult   Pulse: 74   Resp: 12   Temp: 97.8 F (36.6 C)   TempSrc: Temporal   SpO2: 100%   Weight: 138 lb 12.8 oz (63 kg)   Height: 5\' 3"  (1.6  m)      Body mass index is 24.59 kg/m.        {OPTIONAL - GENERAL PHYSICAL EXAM (WILL AUTO-DELETE IF NOT JOAC):166063016}       Allergies   Allergen Reactions   . Aspirin Anaphylaxis and Itching     Prior to Visit Medications    Medication Sig Taking? Authorizing Provider   XIFAXAN 550 MG tablet Take 1 tablet by mouth 2 times daily Yes Vic Blackbird, DO   oxyCODONE HCl (OXY-IR) 10 MG immediate release tablet Take 1 tablet by mouth every 8 hours as needed for Pain for up to 30 days. Intended supply: 30 days Max Daily Amount: 30 mg Yes Vic Blackbird, DO   spironolactone (ALDACTONE) 100 MG tablet Take 1 tablet by mouth daily Yes Historical Provider, MD   propranolol (INDERAL) 10 MG tablet Take 1 tablet by mouth nightly Yes Historical Provider, MD   pantoprazole (PROTONIX) 40 MG tablet Take 1 tablet by mouth every morning Yes Historical Provider, MD   montelukast (SINGULAIR) 10 MG tablet  Yes Historical Provider, MD   furosemide (LASIX) 40 MG tablet Take 1 tablet by mouth daily Yes Historical Provider, MD   doxycycline hyclate (VIBRA-TABS) 100 MG tablet  Yes Historical Provider, MD   QVAR REDIHALER 40 MCG/ACT AERB inhaler INHALE 1 PUFF BY MOUTH TWICE A DAY (MORNING AND EVENING) Yes Historical Provider, MD   albuterol (ACCUNEB) 1.25 MG/3ML nebulizer solution Inhale 3 mLs into the lungs Yes Historical Provider, MD   albuterol sulfate HFA (PROVENTIL;VENTOLIN;PROAIR) 108 (90 Base) MCG/ACT inhaler USE 1 INHALATION EVERY 4 TO 6 HOURS AS NEEDED Yes Historical Provider, MD   Menthol, Topical Analgesic, 10 % LIQD Apply topically Yes Historical Provider, MD   escitalopram (LEXAPRO) 10 MG tablet Take 1 tablet by mouth daily Yes Vic Blackbird, DO   carbamide peroxide (DEBROX) 6.5 % otic solution 5 drops 2 times daily  Patient not taking: Reported on 03/05/2022  Historical Provider, MD   tetrahydrozoline 0.05 % ophthalmic solution 1 drop 3 times daily  Patient not taking: Reported on 03/05/2022  Historical Provider,  MD       CareTeam (Including outside providers/suppliers regularly involved in providing care):   Patient Care Team:  Vic Blackbird, DO as PCP - General (Family Medicine)  Vic Blackbird, DO as PCP - Empaneled Provider     Reviewed and updated this visit:  Allergies  Meds  Med Hx  Surg Hx  Soc Hx  Fam Hx

## 2022-03-22 ENCOUNTER — Encounter

## 2022-03-22 MED ORDER — OXYCODONE HCL 10 MG PO TABS
10 MG | ORAL_TABLET | Freq: Three times a day (TID) | ORAL | 0 refills | Status: AC | PRN
Start: 2022-03-22 — End: 2022-04-05

## 2022-03-22 NOTE — Telephone Encounter (Signed)
Pt called 03-22-22 to check the status of her medication refill. She was crying she is in severe pain.

## 2022-04-22 NOTE — Telephone Encounter (Signed)
[facility]    Received call from [place] at [facility] with [employer].    Subjective: Caller states pain and swelling around umbilicus.    Current Symptoms: Diarrhea for the past 3 days.  Pain at umbilicus which she states is getting worse. Pt states area is warm to touch and pain increases when she presses on it. States the area will not go down when she pushes on it.    Onset: 1 week ago; gradual, worsening    Associated Symptoms: increased wakefulness, diarrhea    Pain Severity: 8/10; sharp; constant    Temperature: denies     What has been tried: Extra strength Excedrin    LMP: NA Pregnant: NA    Recommended disposition: Go to ED Now    Care advice provided, patient verbalizes understanding; denies any other questions or concerns; instructed to call back for any new or worsening symptoms.    Patient/caller agrees to proceed to nearest Emergency Department    Attention Provider:  Thank you for allowing me to participate in the care of your patient.  The patient was connected to triage in response to information provided to the ECC/PSC.  Please do not respond through this encounter as the response is not directed to a shared pool.    Reason for Disposition   SEVERE abdominal pain (e.g., excruciating)    Protocols used: Abdominal Pain - Female-ADULT-[place]

## 2022-05-03 NOTE — ED Notes (Signed)
Formatting of this note might be different from the original.  Pain assessment on discharge was tolerable.  Condition stable.  Patient discharged to home.  Patient education was completed:  yes  Education taught to:  patient  Teaching method used was discussion.  Understanding of teaching was good.  Patient was discharged ambulatory.  Discharged with family.  Valuables were given to: patient.    Electronically signed by Anne Shutter, RN at 05/03/2022 11:35 PM EDT

## 2022-05-03 NOTE — ED Triage Notes (Signed)
Formatting of this note might be different from the original.  Pt has multiple complaints.  Pt is complaining of right sided abdominal pain, lower back pain, right hip pain and wants to be tested for covid due to being sick this morning.    Electronically signed by Gwenlyn Found, RN at 05/03/2022  5:31 PM EDT

## 2022-05-03 NOTE — ED Provider Notes (Signed)
Formatting of this note is different from the original.    [place]    Time of Arrival:   05/03/22 1728    Final diagnoses:   [K74.60, R18.8] Cirrhosis of liver with ascites, unspecified hepatic cirrhosis type ([place]) (Primary)   [M54.50, G89.29] Chronic low back pain without sciatica, unspecified back pain laterality   [R07.9] Chest pain, unspecified type     Medical Decision Making:      Differential Diagnosis:     Diverticulitis, UTI, obstruction, ACS, musculoskeletal    Social Determinants of Health:  social factors reviewed, did not limit treatment                               Supplemental Historians include:  patient    ED Course:   Patient presenting for chest pain as well as back pain and abdominal pain.  Back pain and chest pain chronic in nature.  Getting chest x-ray and screening troponin.  Will get CT of the abdomen and treat symptomatically given reported vomiting.    Patient feeling better after meds.  Work-up overall unremarkable.  Noted cirrhosis and ascites which patient has a history of.  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  Advised that she follow-up with her GI doctor for ongoing management of this.  Troponin negative and given chronicity do not feel that admission needed for further work-up of her chest pain.  Return for any worsening.        Documentation/Prior Results Review:  Initial ED Provider Note , Old medical records, Nursing notes    Rhythm interpretation from monitor: N/A    Imaging Interpreted by me: X-Ray chest without infiltrate    CT ABD/PELVIS-IV ONLY   Final Result     1. Cirrhotic appearance of the liver with imaging findings suggesting portal hypertension again noted.   2. Interval development of pancreatic ductal dilation of unclear etiology on CT.      Consider MRI abdomen without and with contrast and MRCP for further assessment of the above findings.     3. Underdistention versus thickening colonic wall. Correlate for  possible colitis.   4. Moderate to large amount of ascites, increased compared to prior.     Signed By: Roselle Locus, MD on 05/03/2022 11:05 PM       CHEST PA AND LATERAL   Final Result     No acute cardiopulmonary process.     Signed By: Duanne Guess, MD on 05/03/2022 8:08 PM         .     Discussion of Mangement with other Physicians, QHP or Appropriate Source:   None    .    Disposition:  Home    New Prescriptions    No medications on file     Chief Complaint   Patient presents with    ABDOMINAL PAIN    BACK PAIN     Brianna Velazquez is a 71 y.o. female who presents for abdominal pain as well as vomiting that started today.  Also reporting right-sided chest pain has been present for the last month as well as low back pain that radiates into her right hip for a very long time that is unchanged.  No fevers.  States that she feels unwell and wants to be tested for COVID.        Review of Systems:  Constitutional:  Positive for malaise and fatigue.  Negative for fever.   HENT:  Negative for congestion and sore throat.    Respiratory:  Negative for cough and shortness of breath.    Cardiovascular:  Positive for chest pain.   Gastrointestinal:  Positive for nausea, vomiting and abdominal pain. Negative for diarrhea.   Genitourinary:  Negative for dysuria.   Musculoskeletal:  Positive for back pain.   Skin:  Negative for rash.   Neurological:  Negative for headaches.     Physical Exam  Vitals and nursing note reviewed.   Constitutional:       Appearance: She is not diaphoretic.   HENT:      Head: Normocephalic and atraumatic.   Eyes:      Conjunctiva/sclera: Conjunctivae normal.   Cardiovascular:      Rate and Rhythm: Normal rate and regular rhythm.      Heart sounds: Normal heart sounds. No murmur heard.     No friction rub. No gallop.   Pulmonary:      Effort: Pulmonary effort is normal. No respiratory distress.      Breath sounds: Normal breath sounds. No wheezing or rales.   Chest:      Chest wall: No  tenderness.   Abdominal:      General: Bowel sounds are normal. There is no distension.      Palpations: Abdomen is soft. There is no mass.      Tenderness: There is generalized abdominal tenderness and tenderness in the left lower quadrant. There is no guarding or rebound.   Musculoskeletal:         General: No tenderness. Normal range of motion.      Cervical back: Normal range of motion and neck supple.   Skin:     General: Skin is warm and dry.      Coloration: Skin is not pale.      Findings: No erythema or rash.   Neurological:      Mental Status: She is alert.     No past medical history on file.  No past surgical history on file.  No family history on file.  Social History     Occupational History    Not on file   Tobacco Use    Smoking status: Not on file    Smokeless tobacco: Not on file   Substance and Sexual Activity    Alcohol use: Not on file    Drug use: Not on file    Sexual activity: Not on file     No outpatient medications have been marked as taking for the 05/03/22 encounter [facility] Encounter).     Allergies   Allergen Reactions    Asprin Ec Low Dose [Aspirin] hives     Vital Signs:  Patient Vitals for the past 72 hrs:   Temp Heart Rate Resp BP BP Mean SpO2 Weight   05/03/22 1733 -- -- -- -- -- -- 64.5 kg (142 lb 3.2 oz)   05/03/22 1731 97.4 F (36.3 C) 80 18 161/70 100 MM HG 97 % --     Diagnostics:  Labs:    Results for orders placed or performed during the hospital encounter of 05/03/22   URINALYSIS POC (LAB)   Result Value Ref Range    Urine pH 5.5 5.0 - 8.0 pH    Urine Protein Screen Negative Negative mg/dL    Urine Glucose Negative Negative mg/dL    Urine Ketones Negative Negative mg/dL    Urine Occult Blood Moderate (A) Negative  Urine Specific Gravity 1.015 1.003 - 1.030    Urine Nitrite Negative Negative    Urine Leukocyte Esterase Negative Negative    Urine Bilirubin Small (A) Negative    Urine Urobilinogen 4.0 (H) 0.2 - 1.0 mg/dL   CBC WITH DIFFERENTIAL AUTO   Result Value Ref  Range    WBC 7.2 4.0 - 11.0 K/uL    RBC 3.64 (L) 3.80 - 5.20 M/uL    HGB 12.4 11.7 - 16.1 g/dL    HCT 34.9 (L) 35.1 - 48.3 %    MCV 96 80 - 99 fL    MCH 34 26 - 34 pg    MCHC 36 31 - 36 g/dL    RDW 14.4 10.0 - 15.5 %    Platelet 60 (L) 140 - 440 K/uL    MPV 12.0 9.0 - 13.0 fL    Segmented Neutrophils (Auto) 77 (H) 40 - 75 %    Lymphocytes (Auto) 10 (L) 20 - 45 %    Monocytes (Auto) 13 (H) 3 - 12 %    Eosinophils (Auto) 1 0 - 6 %    Basophils (Auto) 0 0 - 2 %    Absolute Neutrophils (Auto) 5.5 1.8 - 7.7 K/uL    Absolute Lymphocytes (Auto) 0.7 (L) 1.0 - 4.8 K/uL    Absolute Monocytes (Auto) 0.9 0.1 - 1.0 K/uL    Absolute Eosinophils (Auto) 0.1 0.0 - 0.5 K/uL    Absolute Basophils (Auto) 0.0 0.0 - 0.2 K/uL   Lipase   Result Value Ref Range    Lipase 23 7 - 60 U/L   HEPATIC FUNCTION PANEL   Result Value Ref Range    Albumin 3.1 (L) 3.5 - 5.0 g/dL    Total Protein 7.4 6.2 - 8.1 g/dL    Globulin 4.3 (H) 2.0 - 4.0 g/dL    A/G Ratio 0.7 (L) 1.1 - 2.6 ratio    Bilirubin Total 4.2 (H) 0.2 - 1.2 mg/dL    Bilirubin Direct 1.8 (H) 0.0 - 0.3 mg/dL    SGOT (AST) 65 (H) 10 - 37 U/L    Alkaline Phosphatase 258 (H) 40 - 120 U/L    SGPT (ALT) 27 5 - 40 U/L   Troponin   Result Value Ref Range    Troponin (T) Quant High Sensitivity (5th Gen) 9 0 - 19 ng/L   REFLEX SLIDE REVIEW   Result Value Ref Range    Normochromic RBC Normochromic     Normocytic RBC Normocytic     Smear Evaluation Platelet count was verified by smear review.    BASIC METABOLIC PANEL   Result Value Ref Range    Potassium 3.0 (L) 3.5 - 5.5 mmol/L    Sodium 138 133 - 145 mmol/L    Chloride 100 98 - 110 mmol/L    Glucose 92 70 - 99 mg/dL    Calcium 8.5 8.4 - 10.5 mg/dL    BUN 9 6 - 22 mg/dL    Creatinine 0.6 (L) 0.8 - 1.4 mg/dL    CO2 29 20 - 32 mmol/L    eGFR >60.0 >60.0 mL/min/1.73 sq.m.    Anion Gap 9.0 3.0 - 15.0 mmol/L   SARS-CoV-2 PCR (COVID-19)    Specimen: Nasopharyngeal Swab; Various culture specimens   Result Value Ref Range    SARS-CoV-2 PCR (COVID-19) Not  Detected Not Detected   INFLUENZA  A  AND B PCR   Result Value Ref Range    Influenza A PCR None Detected  None Detected    Influenza B PCR None Detected None Detected     ECG:  No results found for this visit on 05/03/22.    Medications ordered/given in the ED  Medications   morphine injection 4 mg (4 mg Intravenous Refused 05/03/22 2039)   ondansetron (PF) (Zofran) injection 4 mg (4 mg Intravenous Given 05/03/22 2158)   morphine injection 4 mg (4 mg IV Push Given 05/03/22 2212)       Electronically signed by Marlou Sa, MD at 05/03/2022 11:43 PM EDT

## 2022-05-20 NOTE — Telephone Encounter (Signed)
Voicemail left for patient to contact office to schedule appt for stomach pain

## 2022-05-20 NOTE — Telephone Encounter (Signed)
-----   Message from Geronimo Running sent at 05/16/2022 10:52 AM EDT -----  Subject: Appointment Request    Reason for Call: Established Patient Appointment needed: Urgent Abdominal   Pain    QUESTIONS    Reason for appointment request? No appointments available during search     Additional Information for Provider? Pt. called and is having stomach   pain. She went to the ER on 05/09/22 She is still having stomach pain. No   appt. upon search. Please call her to schedule.  ---------------------------------------------------------------------------  --------------  [facility] INFO  [phone]; OK to leave message on voicemail  ---------------------------------------------------------------------------  --------------  SCRIPT ANSWERS

## 2022-05-23 ENCOUNTER — Ambulatory Visit
Payer: MEDICARE | Attending: Student in an Organized Health Care Education/Training Program | Primary: Student in an Organized Health Care Education/Training Program

## 2022-05-28 ENCOUNTER — Ambulatory Visit
Payer: MEDICARE | Attending: Student in an Organized Health Care Education/Training Program | Primary: Student in an Organized Health Care Education/Training Program

## 2022-07-01 ENCOUNTER — Encounter

## 2022-07-01 ENCOUNTER — Inpatient Hospital Stay: Admit: 2022-07-01 | Payer: MEDICARE | Primary: Family

## 2022-07-01 DIAGNOSIS — R053 Chronic cough: Secondary | ICD-10-CM

## 2022-07-06 LAB — FECAL DNA COLORECTAL CANCER SCREENING (COLOGUARD)

## 2022-07-19 ENCOUNTER — Encounter

## 2022-08-08 NOTE — ED Provider Notes (Signed)
Formatting of this note is different from the original.    [place]    Time of Arrival:   08/08/22 1321    Final diagnoses:   [K74.60, R18.8] Cirrhosis of liver with ascites, unspecified hepatic cirrhosis type ([facility]) (Primary)     Medical Decision Making:      Differential Diagnosis:   Liver cirrhosis versus dependent edema versus unlikely CHF versus renal failure versus unlikely DVT versus nephrotic syndrome versus other acute cause    Likely worsening liver cirrhosis versus noncompliance with medication.  Recently ran out of her Lasix.  Does have lower extremity edema and abdominal distention however no significant abdominal tenderness.  No shortness of breath.  Less likely CHF suspect more likely second to worse liver cirrhosis.  Check CBC, BMP LFTs, INR, BMP, EKG, chest x-ray.  Patient without significant abdominal tenderness at this time.  Normal vital signs.  Normal workup may need Lasix for home and follow-up with GI outpatient.  States having some dark stool rectal exam done however no black stool but was slightly guaiac positive will reassess need further workup for possible GI bleed additionally.    Social Determinants of Health:  social factors reviewed, did not limit treatment                               Supplemental Historians include:  patient    ED Course: Still pending workup for patient.  If normal workup with normal hemoglobin like okay for discharge close follow-up with gastroenterology and restarted on Lasix.  Blood pressure currently stable.  Does not appear to be any paracentesis at this time.  Will give strict return precautions.  Currently at turnover to turnover currently pending blood work for patient.  Was guaiac positive however no melena or dark stool.        Documentation/Prior Results Review:  Old medical records    Rhythm interpretation from monitor: normal sinus rhythm    Imaging Interpreted by me: X-Ray chest x-ray no pulmonary edema, agree with  radiology read    CHEST PORTABLE   Final Result     No acute cardiopulmonary process.     Signed By: Duanne Guess, MD on 08/08/2022 4:07 PM       EKG 12-LEAD    (Results Pending)     .     Discussion of Management with other Physicians, QHP or Appropriate Source:   None    .    Disposition:   turnover    New Prescriptions    No medications on file     Chief Complaint   Patient presents with    EDEMA 13 YEARS OR OLDER     72 year old history of cirrhosis of the liver presenting with worsening swelling in lower extremities and abdomen.  Patient states been going on the last 2 weeks.  Patient denies fevers, chills, nausea, vomiting, shortness of breath.  And did run out of Lasix 1 month ago.  Patient denies current EtOH.  Patient states initially swelling was being relieved on present up at night however no longer improving.  Patient denies orthopnea, dyspnea on exertion.  Patient also stating having some dark stool over the last couple days.  Patient denies anticoagulant use.        Review of Systems    Physical Exam  Exam conducted with a chaperone present.   Constitutional:       General: She  is not in acute distress.     Appearance: Normal appearance. She is well-developed. She is not ill-appearing, toxic-appearing or diaphoretic.   HENT:      Head: Normocephalic and atraumatic.   Eyes:      General: Scleral icterus present.      Conjunctiva/sclera: Conjunctivae normal.   Cardiovascular:      Rate and Rhythm: Normal rate and regular rhythm.   Pulmonary:      Effort: Pulmonary effort is normal. No respiratory distress.      Breath sounds: Normal breath sounds. No stridor. No wheezing, rhonchi or rales.   Abdominal:      General: There is no distension.      Comments: Abdomen distended, mildly tender diffusely, no guarding, no rigidity, abdomen soft   Genitourinary:     Rectum: Guaiac result positive.      Comments: Green appearing stool, guaiac positive, no melena, no bright red blood per rectum  Musculoskeletal:          General: Normal range of motion.      Cervical back: Neck supple.      Comments: +1 lower extremity edema   Skin:     General: Skin is warm.      Coloration: Skin is jaundiced.   Neurological:      Mental Status: She is alert and oriented to person, place, and time.     Past Medical History:   Diagnosis Date    Cirrhosis of liver (HCC)      No past surgical history on file.  No family history on file.  Social History     Occupational History    Not on file   Tobacco Use    Smoking status: Not on file    Smokeless tobacco: Not on file   Substance and Sexual Activity    Alcohol use: Not on file    Drug use: Not on file    Sexual activity: Not on file     No outpatient medications have been marked as taking for the 08/08/22 encounter [facility] Encounter).     Allergies   Allergen Reactions    Asprin Ec Low Dose [Aspirin] hives     Vital Signs:  Patient Vitals for the past 72 hrs:   Temp Heart Rate Resp BP BP Mean SpO2 Weight   08/08/22 1322 97.4 F (36.3 C) 91 18 138/62 87 MM HG 97 % 61.2 kg (135 lb)     Diagnostics:  Labs:    Results for orders placed or performed during the hospital encounter of 08/08/22   Chem8, i-STAT (Lab)   Result Value Ref Range    POTASSIUM 4.0 3.5 - 5.5 mmol/L    CHLORIDE 98 98 - 110 mmol/L    CALCIUM IONIZED 4.6 4.4 - 5.4 mg/dL    CO2 64.3 32.9 - 51.8 mmol/L    Glucose 72 70 - 99 mg/dL    BUN 8 6 - 22 mg/dL    CREATININE 0.6 (L) 0.8 - 1.4 mg/dL    SODIUM 841 660 - 630 mmol/L    HGB 16.0 11.7 - 16.1 g/dL    HCT 16.0 10.9 - 32.3 %    POC-eGFR >60.0 >60.0    Cartridge type CHEM8+      ECG:  No results found for this visit on 08/08/22.    Medications ordered/given in the ED  Medications - No data to display      Electronically signed by Gelene Mink, MD at 08/08/2022  5:36 PM EST

## 2022-08-08 NOTE — ED Triage Notes (Signed)
Formatting of this note might be different from the original.  Reports increased swelling to her abdomen, hands and feet. Reports a hx of CHF.  Electronically signed by Electa Sniff, RN at 08/08/2022  1:22 PM EST

## 2022-08-08 NOTE — ED Notes (Signed)
Formatting of this note is different from the original.  Pain assessment on discharge was 0..  Condition stable.  Patient discharged to home.  Patient education was completed:  yes  Education taught to:  patient  Teaching method used was handout.  Understanding of teaching was good.  Patient was discharged via wheelchair.  Discharged with self.  Valuables were given to: patient. Pt to wait in ER [place] for ride home  Current Discharge Medication List       Electronically signed by Greer Pickerel, RN at 08/08/2022  6:06 PM EST

## 2022-08-08 NOTE — ED Notes (Signed)
Formatting of this note might be different from the original.  PIV attempt L FA 22G unsuccessful. Pt refusing further attempts for IV and straight stick. Will obtain USG IV nurse to get access.    1650: Kim, RN to obtain USG IV access and lab work.   Electronically signed by Greer Pickerel, RN at 08/08/2022  4:52 PM EST

## 2022-08-08 NOTE — Progress Notes (Signed)
Formatting of this note is different from the original.  Pharmacy Best Possible Medication History (BPMH) Note    Rx Adm Med Hx: Complete  List updated prior to admission med rec?: Yes    Medication information was obtained from:  patient and electronic claims database  Contact information:    Medication history interview was conducted: in person  PPE worn: mask-surgical  Patient masked: No    Med Hx Notes:   Spoke with patient, who confirmed home medications and last doses taken.    Review Prior to Admission (PTA) medication list for more details.    Allergies   Allergen Reactions    Asprin Ec Low Dose [Aspirin] hives     Prior to Admission Medications   Prescriptions Last Dose Informant Patient Reported? Taking?   beclomethasone dipropionate (QVAR REDIHALER) 40 mcg/actuation INH HFAB 08/08/2022 Self Yes Yes   Sig: Take 4 Puffs inhaled by mouth Twice Daily. (MORNING AND EVENING)   fluticasone propionate (FLONASE) 50 mcg/actuation NA SpSn PRN Self Yes Yes   Sig: 1 Spray by each nostril route Once a Day.   furosemide (LASIX) 40 mg PO TABS   Yes No   Sig: Take 1 Tab by Mouth 2X Daily Diuretic.   furosemide (LASIX) 40 mg PO TABS Past Month Self No Yes   Sig: Take 1 Tab by Mouth 2X Daily Diuretic for 30 days.   oxyCODONE 10 mg PO TABS 08/08/2022 at am Self Yes Yes   Sig: Take 10 mg by Mouth 3 Times Daily.   rifAXIMin (XIFAXAN) 550 mg PO TABS Past Month Self Yes Yes   Sig: Take 1 Tab by Mouth Every 12 hours.     Facility-Administered Medications: None     Medications Discontinued During This Encounter   Medication Reason    furosemide (LASIX) 40 mg PO TABS     predniSONE (DELTASONE) 10 mg PO TABS Removed from list: Not a Home Med       Electronically signed by Jason Fila at 08/08/2022  5:39 PM EST

## 2022-08-16 NOTE — ED Notes (Signed)
Formatting of this note might be different from the original.  BP 94/64.  Dr. Simms aware.  Pt with no further complaints.   Electronically signed by Wolter, Alexandra M, RN at 08/16/2022  2:44 PM EST

## 2022-08-16 NOTE — ED Provider Notes (Signed)
Formatting of this note is different from the original.  ED Observation Admission    ED Observation Admission   Date/Time: 08/16/2022 at 1229    Assessment / Plan:  Paracentesis with interventional radiology    Consults while in ED Observation: Interventional radiology    Subjective:   Brianna Velazquez is a 72 y.o. 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       Objective:  Patient Vitals for the past 8 hrs:   Temp Heart Rate Pulse Resp BP BP Mean SpO2 Weight   08/16/22 1100 -- 84 83 17 126/73 88 MM HG 100 % --   08/16/22 1030 -- 78 77 10 -- -- 99 % --   08/16/22 0939 97.9 F (36.6 C) 90 -- 18 126/77 93 MM HG 99 % --   08/16/22 0938 -- -- -- -- -- -- -- 70.3 kg (155 lb)     Exam:   Constitutional:       General: She is not in acute distress.     Comments: Chronically ill-appearing elderly African-American female   HENT:      Head: Normocephalic and atraumatic.   Eyes:      Conjunctiva/sclera: Conjunctivae normal.   Cardiovascular:      Rate and Rhythm: Normal rate and regular rhythm.      Heart sounds: Normal heart sounds.   Pulmonary:      Effort: Pulmonary effort is normal. No respiratory distress.      Breath sounds: Normal breath sounds. No wheezing or rales.   Abdominal:      General: There is distension.      Tenderness: There is no abdominal tenderness.      Comments: No reproducible abdominal tenderness   Musculoskeletal:      Cervical back: Normal range of motion.      Comments: Pitting edema extending up to proximal legs bilaterally   Skin:     General: Skin is warm and dry.      Findings: No erythema or rash.   Neurological:      General: No focal deficit present.      Mental Status: She is alert.   Psychiatric:         Mood and Affect: Mood normal.     Independently reviewed lab and imaging results from ED course:  Results for orders  placed or performed during the hospital encounter of 08/16/22   Chem8, i-STAT (Lab)   Result Value Ref Range    POTASSIUM 3.0 (L) 3.5 - 5.5 mmol/L    CHLORIDE 94 (L) 98 - 110 mmol/L    CALCIUM IONIZED 4.5 4.4 - 5.4 mg/dL    CO2 37.0 (H) 20.0 - 32.0 mmol/L    Glucose 77 70 - 99 mg/dL    BUN 9 6 - 22 mg/dL    CREATININE 0.7 (L) 0.8 - 1.4 mg/dL    SODIUM 141 133 - 145 mmol/L    HGB 13.9 11.7 - 16.1 g/dL    HCT 41.0 35.1 - 48.3 %    POC-eGFR >60.0 >60.0    Cartridge type XLKG4+    BASIC METABOLIC PANEL   Result Value Ref Range    Potassium 2.9 (LL) 3.5 - 5.5 mmol/L    Sodium 139 133 - 145 mmol/L    Chloride 98 98 - 110 mmol/L  Glucose 75 70 - 99 mg/dL    Calcium 8.5 8.4 - 10.5 mg/dL    BUN 9 6 - 22 mg/dL    Creatinine 0.4 (L) 0.8 - 1.4 mg/dL    CO2 32 20 - 32 mmol/L    eGFR >60.0 >60.0 mL/min/1.73 sq.m.    Anion Gap 9.0 3.0 - 15.0 mmol/L   HEPATIC FUNCTION PANEL   Result Value Ref Range    Albumin 2.7 (L) 3.5 - 5.0 g/dL    Total Protein 7.4 6.2 - 8.1 g/dL    Globulin 4.7 (H) 2.0 - 4.0 g/dL    A/G Ratio 0.6 (L) 1.1 - 2.6 ratio    Bilirubin Total 2.7 (H) 0.2 - 1.2 mg/dL    Bilirubin Direct 1.2 (H) 0.0 - 0.3 mg/dL    SGOT (AST) 67 (H) 10 - 37 U/L    Alkaline Phosphatase 214 (H) 40 - 120 U/L    SGPT (ALT) 24 5 - 40 U/L   PT-INR   Result Value Ref Range    Protime 15.5 (H) 9.0 - 13.0 sec    INR 1.43 (H) 0.89 - 1.29   NT PROBNP   Result Value Ref Range    NT proBNP 157 (H) <=125 pg/mL   CBC WITH DIFFERENTIAL AUTO   Result Value Ref Range    WBC 3.3 (L) 4.0 - 11.0 K/uL    RBC 3.70 (L) 3.80 - 5.20 M/uL    HGB 12.7 11.7 - 16.1 g/dL    HCT 35.5 35.1 - 48.3 %    MCV 96 80 - 99 fL    MCH 34 26 - 34 pg    MCHC 36 31 - 36 g/dL    RDW 13.9 10.0 - 15.5 %    Platelet 68 (L) 140 - 440 K/uL    MPV 10.4 9.0 - 13.0 fL    Immature Platelet Fraction 3.6 1.1 - 7.1 %    Segmented Neutrophils (Auto) 65 40 - 75 %    Lymphocytes (Auto) 16 (L) 20 - 45 %    Monocytes (Auto) 14 (H) 3 - 12 %    Eosinophils (Auto) 5 0 - 6 %    Basophils (Auto) 1 0 -  2 %    Absolute Neutrophils (Auto) 2.1 1.8 - 7.7 K/uL    Absolute Lymphocytes (Auto) 0.5 (L) 1.0 - 4.8 K/uL    Absolute Monocytes (Auto) 0.5 0.1 - 1.0 K/uL    Absolute Eosinophils (Auto) 0.2 0.0 - 0.5 K/uL    Absolute Basophils (Auto) 0.0 0.0 - 0.2 K/uL   MAGNESIUM SERUM   Result Value Ref Range    Magnesium 1.6 1.6 - 2.5 mg/dL     Results for orders placed or performed during the hospital encounter of 08/16/22   EKG 12-LEAD   Result Value Ref Range Status    Heart Rate 78 bpm Preliminary    RR Interval 776 ms Preliminary    Atrial Rate 77 ms Preliminary    P-R Interval 140 ms Preliminary    P Duration 111 ms Preliminary    P Horizontal Axis -4 deg Preliminary    P Front Axis 71 deg Preliminary    Q Onset 502 ms Preliminary    QRSD Interval 58 ms Preliminary    QT Interval 406 ms Preliminary    QTcB 461 ms Preliminary    QTcF 443 ms Preliminary    QRS Horizontal Axis 3 deg Preliminary    QRS Axis 52 deg Preliminary  I-40 Front Axis 65 deg Preliminary    t-40 Horizontal Axis -8 deg Preliminary    T-40 Front Axis 50 deg Preliminary    T Horizontal Axis 28 deg Preliminary    T Wave Axis 41 deg Preliminary    S-T Horizontal Axis 48 deg Preliminary    S-T Front Axis 51 deg Preliminary    Impression - OTHERWISE NORMAL ECG -  Preliminary    Impression SR-Sinus rhythm-normal P axis, V-rate 50-99  Preliminary    Impression   Preliminary     APC-Atrial premature complex-SV complex w/ short R-R interval     EKG 12-LEAD       CHEST PORTABLE   Final Result     No acute cardiopulmonary process.     Signed By: Blanch Media, MD on 08/16/2022 12:06 PM       PARACENTESIS BY RADIOLOGY    (Results Pending)     Electronically signed by Carylon Perches, MD at 08/16/2022 12:29 PM EST

## 2022-08-16 NOTE — ED Notes (Signed)
Formatting of this note might be different from the original.  Pt received from triage.  Pt alert and oriented x4.  Pt states she has generalized pain related to abdominal pain/distention. Denies any nausea/vomiting.  Pt states she has had to have fluid drained from abdomen in the past.  Pt has been having increased pain x about a month.  VS stable. Call bell within reach.   Electronically signed by Jerene Dilling, RN at 08/16/2022  2:42 PM EST

## 2022-08-16 NOTE — Progress Notes (Signed)
Formatting of this note might be different from the original.  Procedure Performed: PARACENTESIS  Time needle pulled: 1330  Volume removed: 5 L  Site location and dressing type: RLQ; DERMABOND, GAUZE, AND TEGADERM   Length of bedrest: N/A hours  Anticoagulation restart: Please see IR PA or physicians post procedural note.   Specific Instructions (ie:positioning): Patient's choice/ right side down  Current vital signs: BP: 117/58  Heart Rate: 78 (08/16/22 1340)  Pulse: 83 (08/16/22 1100)  Temp: 97.9 F (36.6 C)  Resp: 17  Height: 5' 2" (157.5 cm)  Weight: 70.3 kg (155 lb)  BMI (Calculated): 28.34  SpO2: 99 %  Temp (24hrs), Avg:97.9 F (36.6 C), Min:97.9 F (36.6 C), Max:97.9 F (36.6 C)    PT TOLERATED PROCEDURE WELL.  Electronically signed by Koechle, Juliana at 08/16/2022  1:41 PM EST

## 2022-08-16 NOTE — ED Provider Notes (Signed)
Formatting of this note is different from the original.  Observation Discharge Note    Date/Time Obs Discharge: 08/16/2022 at 1531    Patient reports much improvement in symptoms after paracentesis.   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    Objective:  Patient Vitals for the past 8 hrs:   Temp Heart Rate Pulse Resp BP BP Mean SpO2 Weight   08/16/22 1425 -- 87 -- 12 110/61 76 MM HG -- --   08/16/22 1340 -- 78 -- -- 117/58 78 MM HG 99 % --   08/16/22 1100 -- 84 83 17 126/73 88 MM HG 100 % --   08/16/22 1030 -- 78 77 10 -- -- 99 % --   08/16/22 0939 97.9 F (36.6 C) 90 -- 18 126/77 93 MM HG 99 % --   08/16/22 0938 -- -- -- -- -- -- -- 70.3 kg (155 lb)     Exam:   Laying on stretcher.  Appears comfortable.    Observation Course Summary: Paracentesis with IR    Consults while in ED Observation: Interventional radiology    Discharge Assessment & Plan:    Plan discharge home. Considered inpatient admission however do not think this is necessary       Discharge Instructions and follow-up as noted in system.    Final Diagnosis: (K74.60,  R18.8) Cirrhosis of liver with ascites, unspecified hepatic cirrhosis type (HCC)    (E87.6) Hypokalemia    (E83.42) Hypomagnesemia    Independently reviewed lab and imaging results from ED course:  Results for orders placed or performed during the hospital encounter of 08/16/22   Chem8, i-STAT (Lab)   Result Value Ref Range    POTASSIUM 3.0 (L) 3.5 - 5.5 mmol/L    CHLORIDE 94 (L) 98 - 110 mmol/L    CALCIUM IONIZED 4.5 4.4 - 5.4 mg/dL    CO2 37.0 (H) 20.0 - 32.0 mmol/L    Glucose 77 70 - 99 mg/dL    BUN  9 6 - 22 mg/dL    CREATININE 0.7 (L) 0.8 - 1.4 mg/dL    SODIUM 141 133 - 145 mmol/L    HGB 13.9 11.7 - 16.1 g/dL    HCT 41.0 35.1 - 48.3 %    POC-eGFR >60.0 >60.0    Cartridge type ZOXW9+    BASIC METABOLIC PANEL   Result Value Ref Range    Potassium 2.9 (LL) 3.5 - 5.5 mmol/L    Sodium 139 133 - 145 mmol/L    Chloride 98 98 - 110 mmol/L    Glucose 75 70 - 99 mg/dL    Calcium 8.5 8.4 - 10.5 mg/dL    BUN 9 6 - 22 mg/dL    Creatinine 0.4 (L) 0.8 - 1.4 mg/dL    CO2 32 20 - 32 mmol/L    eGFR >60.0 >60.0 mL/min/1.73 sq.m.    Anion Gap 9.0 3.0 - 15.0 mmol/L   HEPATIC FUNCTION PANEL   Result Value Ref Range    Albumin 2.7 (L) 3.5 - 5.0 g/dL    Total Protein 7.4  6.2 - 8.1 g/dL    Globulin 4.7 (H) 2.0 - 4.0 g/dL    A/G Ratio 0.6 (L) 1.1 - 2.6 ratio    Bilirubin Total 2.7 (H) 0.2 - 1.2 mg/dL    Bilirubin Direct 1.2 (H) 0.0 - 0.3 mg/dL    SGOT (AST) 67 (H) 10 - 37 U/L    Alkaline Phosphatase 214 (H) 40 - 120 U/L    SGPT (ALT) 24 5 - 40 U/L   PT-INR   Result Value Ref Range    Protime 15.5 (H) 9.0 - 13.0 sec    INR 1.43 (H) 0.89 - 1.29   NT PROBNP   Result Value Ref Range    NT proBNP 157 (H) <=125 pg/mL   CBC WITH DIFFERENTIAL AUTO   Result Value Ref Range    WBC 3.3 (L) 4.0 - 11.0 K/uL    RBC 3.70 (L) 3.80 - 5.20 M/uL    HGB 12.7 11.7 - 16.1 g/dL    HCT 35.5 35.1 - 48.3 %    MCV 96 80 - 99 fL    MCH 34 26 - 34 pg    MCHC 36 31 - 36 g/dL    RDW 13.9 10.0 - 15.5 %    Platelet 68 (L) 140 - 440 K/uL    MPV 10.4 9.0 - 13.0 fL    Immature Platelet Fraction 3.6 1.1 - 7.1 %    Segmented Neutrophils (Auto) 65 40 - 75 %    Lymphocytes (Auto) 16 (L) 20 - 45 %    Monocytes (Auto) 14 (H) 3 - 12 %    Eosinophils (Auto) 5 0 - 6 %    Basophils (Auto) 1 0 - 2 %    Absolute Neutrophils (Auto) 2.1 1.8 - 7.7 K/uL    Absolute Lymphocytes (Auto) 0.5 (L) 1.0 - 4.8 K/uL    Absolute Monocytes (Auto) 0.5 0.1 - 1.0 K/uL    Absolute Eosinophils (Auto) 0.2 0.0 - 0.5 K/uL    Absolute Basophils (Auto) 0.0 0.0 - 0.2 K/uL   MAGNESIUM SERUM   Result Value Ref  Range    Magnesium 1.6 1.6 - 2.5 mg/dL     Results for orders placed or performed during the hospital encounter of 08/16/22   EKG 12-LEAD   Result Value Ref Range Status    Heart Rate 78 bpm Preliminary    RR Interval 776 ms Preliminary    Atrial Rate 77 ms Preliminary    P-R Interval 140 ms Preliminary    P Duration 111 ms Preliminary    P Horizontal Axis -4 deg Preliminary    P Front Axis 71 deg Preliminary    Q Onset 502 ms Preliminary    QRSD Interval 58 ms Preliminary    QT Interval 406 ms Preliminary    QTcB 461 ms Preliminary    QTcF 443 ms Preliminary    QRS Horizontal Axis 3 deg Preliminary    QRS Axis 52 deg Preliminary    I-40 Front Axis 65 deg Preliminary    t-40 Horizontal Axis -8 deg Preliminary    T-40 Front Axis 50 deg Preliminary    T Horizontal Axis 28 deg Preliminary    T Wave Axis 41 deg Preliminary    S-T Horizontal Axis 48 deg Preliminary    S-T Front Axis 51 deg Preliminary    Impression - OTHERWISE NORMAL ECG -  Preliminary    Impression SR-Sinus rhythm-normal P axis, V-rate 50-99  Preliminary  Impression   Preliminary     APC-Atrial premature complex-SV complex w/ short R-R interval     EKG 12-LEAD       CHEST PORTABLE   Final Result     No acute cardiopulmonary process.     Signed By: Maciej S Tobola, MD on 08/16/2022 12:06 PM       PARACENTESIS BY RADIOLOGY    (Results Pending)     Electronically signed by Simms, Andrea, MD at 08/16/2022  3:58 PM EST

## 2022-08-16 NOTE — ED Provider Notes (Signed)
Formatting of this note is different from the original.  Observation Discharge Note    Date/Time Obs Discharge: 08/16/2022 at 1531    Patient reports much improvement in symptoms after paracentesis.   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    Objective:  Patient Vitals for the past 8 hrs:   Temp Heart Rate Pulse Resp BP BP Mean SpO2 Weight   08/16/22 1425 -- 87 -- 12 110/61 76 MM HG -- --   08/16/22 1340 -- 78 -- -- 117/58 78 MM HG 99 % --   08/16/22 1100 -- 84 83 17 126/73 88 MM HG 100 % --   08/16/22 1030 -- 78 77 10 -- -- 99 % --   08/16/22 0939 97.9 F (36.6 C) 90 -- 18 126/77 93 MM HG 99 % --   08/16/22 0938 -- -- -- -- -- -- -- 70.3 kg (155 lb)     Exam:   Laying on stretcher.  Appears comfortable.    Observation Course Summary: Paracentesis with IR    Consults while in ED Observation: Interventional radiology    Discharge Assessment & Plan:    Plan discharge home. Considered inpatient admission however do not think this is necessary       Discharge Instructions and follow-up as noted in system.    Final Diagnosis: (K74.60,  R18.8) Cirrhosis of liver with ascites, unspecified hepatic cirrhosis type (HCC)    (E87.6) Hypokalemia    (E83.42) Hypomagnesemia    Independently reviewed lab and imaging results from ED course:  Results for orders placed or performed during the hospital encounter of 08/16/22   Chem8, i-STAT (Lab)   Result Value Ref Range    POTASSIUM 3.0 (L) 3.5 - 5.5 mmol/L    CHLORIDE 94 (L) 98 - 110 mmol/L    CALCIUM IONIZED 4.5 4.4 - 5.4 mg/dL    CO2 37.0 (H) 20.0 - 32.0 mmol/L    Glucose 77 70 - 99 mg/dL    BUN  9 6 - 22 mg/dL    CREATININE 0.7 (L) 0.8 - 1.4 mg/dL    SODIUM 141 133 - 145 mmol/L    HGB 13.9 11.7 - 16.1 g/dL    HCT 41.0 35.1 - 48.3 %    POC-eGFR >60.0 >60.0    Cartridge type ZOXW9+    BASIC METABOLIC PANEL   Result Value Ref Range    Potassium 2.9 (LL) 3.5 - 5.5 mmol/L    Sodium 139 133 - 145 mmol/L    Chloride 98 98 - 110 mmol/L    Glucose 75 70 - 99 mg/dL    Calcium 8.5 8.4 - 10.5 mg/dL    BUN 9 6 - 22 mg/dL    Creatinine 0.4 (L) 0.8 - 1.4 mg/dL    CO2 32 20 - 32 mmol/L    eGFR >60.0 >60.0 mL/min/1.73 sq.m.    Anion Gap 9.0 3.0 - 15.0 mmol/L   HEPATIC FUNCTION PANEL   Result Value Ref Range    Albumin 2.7 (L) 3.5 - 5.0 g/dL    Total Protein 7.4  6.2 - 8.1 g/dL    Globulin 4.7 (H) 2.0 - 4.0 g/dL    A/G Ratio 0.6 (L) 1.1 - 2.6 ratio    Bilirubin Total 2.7 (H) 0.2 - 1.2 mg/dL    Bilirubin Direct 1.2 (H) 0.0 - 0.3 mg/dL    SGOT (AST) 67 (H) 10 - 37 U/L    Alkaline Phosphatase 214 (H) 40 - 120 U/L    SGPT (ALT) 24 5 - 40 U/L   PT-INR   Result Value Ref Range    Protime 15.5 (H) 9.0 - 13.0 sec    INR 1.43 (H) 0.89 - 1.29   NT PROBNP   Result Value Ref Range    NT proBNP 157 (H) <=125 pg/mL   CBC WITH DIFFERENTIAL AUTO   Result Value Ref Range    WBC 3.3 (L) 4.0 - 11.0 K/uL    RBC 3.70 (L) 3.80 - 5.20 M/uL    HGB 12.7 11.7 - 16.1 g/dL    HCT 35.5 35.1 - 48.3 %    MCV 96 80 - 99 fL    MCH 34 26 - 34 pg    MCHC 36 31 - 36 g/dL    RDW 13.9 10.0 - 15.5 %    Platelet 68 (L) 140 - 440 K/uL    MPV 10.4 9.0 - 13.0 fL    Immature Platelet Fraction 3.6 1.1 - 7.1 %    Segmented Neutrophils (Auto) 65 40 - 75 %    Lymphocytes (Auto) 16 (L) 20 - 45 %    Monocytes (Auto) 14 (H) 3 - 12 %    Eosinophils (Auto) 5 0 - 6 %    Basophils (Auto) 1 0 - 2 %    Absolute Neutrophils (Auto) 2.1 1.8 - 7.7 K/uL    Absolute Lymphocytes (Auto) 0.5 (L) 1.0 - 4.8 K/uL    Absolute Monocytes (Auto) 0.5 0.1 - 1.0 K/uL    Absolute Eosinophils (Auto) 0.2 0.0 - 0.5 K/uL    Absolute Basophils (Auto) 0.0 0.0 - 0.2 K/uL   MAGNESIUM SERUM   Result Value Ref  Range    Magnesium 1.6 1.6 - 2.5 mg/dL     Results for orders placed or performed during the hospital encounter of 08/16/22   EKG 12-LEAD   Result Value Ref Range Status    Heart Rate 78 bpm Preliminary    RR Interval 776 ms Preliminary    Atrial Rate 77 ms Preliminary    P-R Interval 140 ms Preliminary    P Duration 111 ms Preliminary    P Horizontal Axis -4 deg Preliminary    P Front Axis 71 deg Preliminary    Q Onset 502 ms Preliminary    QRSD Interval 58 ms Preliminary    QT Interval 406 ms Preliminary    QTcB 461 ms Preliminary    QTcF 443 ms Preliminary    QRS Horizontal Axis 3 deg Preliminary    QRS Axis 52 deg Preliminary    I-40 Front Axis 65 deg Preliminary    t-40 Horizontal Axis -8 deg Preliminary    T-40 Front Axis 50 deg Preliminary    T Horizontal Axis 28 deg Preliminary    T Wave Axis 41 deg Preliminary    S-T Horizontal Axis 48 deg Preliminary    S-T Front Axis 51 deg Preliminary    Impression - OTHERWISE NORMAL ECG -  Preliminary    Impression SR-Sinus rhythm-normal P axis, V-rate 50-99  Preliminary  Impression   Preliminary     APC-Atrial premature complex-SV complex w/ short R-R interval     EKG 12-LEAD       CHEST PORTABLE   Final Result     No acute cardiopulmonary process.     Signed By: Blanch Media, MD on 08/16/2022 12:06 PM       PARACENTESIS BY RADIOLOGY    (Results Pending)     Electronically signed by Carylon Perches, MD at 08/16/2022  3:58 PM EST

## 2022-08-16 NOTE — ED Notes (Signed)
Formatting of this note might be different from the original.  PT off unit  Electronically signed by Miller, Kristin, RN at 08/16/2022  1:15 PM EST

## 2022-08-16 NOTE — ED Notes (Signed)
Formatting of this note might be different from the original.  Pt is a Jehovah's Witness and can not receive albumin post paracentesis.   Dr. Simms aware.   Electronically signed by Wolter, Alexandra M, RN at 08/16/2022  3:00 PM EST

## 2022-08-16 NOTE — ED Notes (Signed)
Formatting of this note might be different from the original.  Pt returned from IR in stable condition. Pt states she is feeling much better. Abdomen less distended.  VS stable.    Electronically signed by Jerene Dilling, RN at 08/16/2022  2:43 PM EST

## 2022-08-16 NOTE — ED Notes (Signed)
Formatting of this note might be different from the original.  PT off unit  Electronically signed by Loura Back, RN at 08/16/2022  1:15 PM EST

## 2022-08-16 NOTE — ED Provider Notes (Signed)
Formatting of this note is different from the original.    [facility]    Time of Arrival:   08/16/22 1610    Final diagnoses:   [K74.60, R18.8] Cirrhosis of liver with ascites, unspecified hepatic cirrhosis type ([place]) (Primary)   [E87.6] Hypokalemia   [E83.42] Hypomagnesemia     Medical Decision Making:    Eadie Repetto is a 72 y.o. 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      Differential Diagnosis:     Symptoms likely due to ascites from cirrhosis, however, will evaluate for CHF, PNA, electrolyte abnormality    Patient presented to the ED afebrile, hemodynamically stable, satting well on room air  EKG with sinus rhythm, rate of 78, PAC appreciated, unchanged compared to prior  Limited bedside ultrasound performed by this examiner with evidence of ascites  Will perform basic labs including hepatic panel, INR, BNP as well as CXR  Will contact interventional radiology for paracentesis to be performed  Will give dose of oxycodone in the ED per patient request, reassess    Social Determinants of Health:  social factors reviewed, did not limit treatment                       Glasgow Coma Scale Score: 15          Supplemental Historians include:  patient and medical records    ED Course:   Basic labs significant for leukopenia to 3.3, hypokalemia to 3.0, mild transaminitis with AST of 67 (decreased compared to prior), elevated total bilirubin to 2.7, direct bilirubin to 1.2, alkaline phosphatase 08/30/2012, mildly elevated BNP to 157 (similar compared to prior), otherwise within normal limits  CXR with no evidence of infiltrate or effusion    Patient was ordered for 40 mEq p.o. potassium x 2 as well as 2 g of magnesium sulfate IV    Interventional radiology contacted.  Paracentesis orders placed    Will place  patient on ED observation status with plans for patient to have paracentesis performed prior to disposition.        Documentation/Prior Results Review:  Old medical records, Previous electrocardiograms, Nursing notes, Previous radiology studies    Rhythm interpretation from monitor: normal sinus rhythm    Imaging Interpreted by me: X-Ray chest with no obvious infiltrate on my read    EKG 12-LEAD       CHEST PORTABLE   Final Result     No acute cardiopulmonary process.     Signed By: Duanne Guess, MD on 08/16/2022 12:06 PM       PARACENTESIS BY RADIOLOGY    (Results Pending)     .     Discussion of Management with other Physicians, QHP or Appropriate Source:   Consultant interventional radiology    .    Disposition:  ED Observation    New Prescriptions    No medications on file     Chief Complaint   Patient presents with    ABDOMINAL DISTENTION    LEG SWELLING     Keighley Deckman is a 72 y.o. 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  Patient reports history of similar  symptoms and states her last paracentesis was on 07/24/2022.  Patient reports pain in the area that she rates a 10/10 and describes as tightness.  Patient denies any radiation of the pain.  Patient denies taking medication for the pain.  Patient also reports intermittent shortness of breath, bilateral lower extremity swelling which improves with elevating her legs and is exacerbated by prolonged standing.  Patient denies any fever, chills, abnormal cough, nausea, vomiting, diarrhea, blood in stool.  Patient denies any anticoagulant use.  Patient reports a scheduled colonoscopy for next month.        Review of Systems:  Constitutional:  Negative for fever and chills.   HENT:  Negative for sore throat.    Eyes:  Negative for visual disturbance.   Respiratory:  Positive for  shortness of breath. Negative for cough.    Cardiovascular:  Positive for leg swelling.   Gastrointestinal:  Positive for abdominal pain. Negative for nausea and vomiting.   Genitourinary:  Negative for dysuria and hematuria.   Musculoskeletal:  Negative for back pain and joint swelling.   Skin:  Negative for rash.   Neurological:  Negative for numbness and headaches.     Physical Exam  Vitals and nursing note reviewed.   Constitutional:       General: She is not in acute distress.     Comments: Chronically ill-appearing elderly African-American female   HENT:      Head: Normocephalic and atraumatic.   Eyes:      Conjunctiva/sclera: Conjunctivae normal.   Cardiovascular:      Rate and Rhythm: Normal rate and regular rhythm.      Heart sounds: Normal heart sounds.   Pulmonary:      Effort: Pulmonary effort is normal. No respiratory distress.      Breath sounds: Normal breath sounds. No wheezing or rales.   Abdominal:      General: There is distension.      Tenderness: There is no abdominal tenderness.      Comments: No reproducible abdominal tenderness   Musculoskeletal:      Cervical back: Normal range of motion.      Comments: Pitting edema extending up to proximal legs bilaterally   Skin:     General: Skin is warm and dry.      Findings: No erythema or rash.   Neurological:      General: No focal deficit present.      Mental Status: She is alert.   Psychiatric:         Mood and Affect: Mood normal.     Past Medical History:   Diagnosis Date    Cirrhosis of liver (HCC)      No past surgical history on file.  No family history on file.  Social History     Occupational History    Not on file   Tobacco Use    Smoking status: Not on file    Smokeless tobacco: Not on file   Substance and Sexual Activity    Alcohol use: Not on file    Drug use: Not on file    Sexual activity: Not on file     No outpatient medications have been marked as taking for the 08/16/22 encounter [facility] Encounter).     Allergies   Allergen  Reactions    Asprin Ec Low Dose [Aspirin] hives     Vital Signs:  Patient Vitals for the past 72 hrs:   Temp Heart Rate Pulse Resp BP BP  Mean SpO2 Weight   08/16/22 1100 -- 84 83 17 126/73 88 MM HG 100 % --   08/16/22 1030 -- 78 77 10 -- -- 99 % --   08/16/22 0939 97.9 F (36.6 C) 90 -- 18 126/77 93 MM HG 99 % --   08/16/22 0938 -- -- -- -- -- -- -- 70.3 kg (155 lb)     Diagnostics:  Labs:    Results for orders placed or performed during the hospital encounter of 08/16/22   Chem8, i-STAT (Lab)   Result Value Ref Range    POTASSIUM 3.0 (L) 3.5 - 5.5 mmol/L    CHLORIDE 94 (L) 98 - 110 mmol/L    CALCIUM IONIZED 4.5 4.4 - 5.4 mg/dL    CO2 37.0 (H) 20.0 - 32.0 mmol/L    Glucose 77 70 - 99 mg/dL    BUN 9 6 - 22 mg/dL    CREATININE 0.7 (L) 0.8 - 1.4 mg/dL    SODIUM 141 133 - 145 mmol/L    HGB 13.9 11.7 - 16.1 g/dL    HCT 41.0 35.1 - 48.3 %    POC-eGFR >60.0 >60.0    Cartridge type [id number]+    BASIC METABOLIC PANEL   Result Value Ref Range    Potassium 2.9 (LL) 3.5 - 5.5 mmol/L    Sodium 139 133 - 145 mmol/L    Chloride 98 98 - 110 mmol/L    Glucose 75 70 - 99 mg/dL    Calcium 8.5 8.4 - 10.5 mg/dL    BUN 9 6 - 22 mg/dL    Creatinine 0.4 (L) 0.8 - 1.4 mg/dL    CO2 32 20 - 32 mmol/L    eGFR >60.0 >60.0 mL/min/1.73 sq.m.    Anion Gap 9.0 3.0 - 15.0 mmol/L   HEPATIC FUNCTION PANEL   Result Value Ref Range    Albumin 2.7 (L) 3.5 - 5.0 g/dL    Total Protein 7.4 6.2 - 8.1 g/dL    Globulin 4.7 (H) 2.0 - 4.0 g/dL    A/G Ratio 0.6 (L) 1.1 - 2.6 ratio    Bilirubin Total 2.7 (H) 0.2 - 1.2 mg/dL    Bilirubin Direct 1.2 (H) 0.0 - 0.3 mg/dL    SGOT (AST) 67 (H) 10 - 37 U/L    Alkaline Phosphatase 214 (H) 40 - 120 U/L    SGPT (ALT) 24 5 - 40 U/L   PT-INR   Result Value Ref Range    Protime 15.5 (H) 9.0 - 13.0 sec    INR 1.43 (H) 0.89 - 1.29   NT PROBNP   Result Value Ref Range    NT proBNP 157 (H) <=125 pg/mL   CBC WITH DIFFERENTIAL AUTO   Result Value Ref Range    WBC 3.3 (L) 4.0 - 11.0 K/uL    RBC 3.70 (L) 3.80 - 5.20 M/uL    HGB 12.7  11.7 - 16.1 g/dL    HCT 35.5 35.1 - 48.3 %    MCV 96 80 - 99 fL    MCH 34 26 - 34 pg    MCHC 36 31 - 36 g/dL    RDW 13.9 10.0 - 15.5 %    Platelet 68 (L) 140 - 440 K/uL    MPV 10.4 9.0 - 13.0 fL    Immature Platelet Fraction 3.6 1.1 - 7.1 %    Segmented Neutrophils (Auto) 65 40 - 75 %    Lymphocytes (Auto) 16 (L) 20 - 45 %  Monocytes (Auto) 14 (H) 3 - 12 %    Eosinophils (Auto) 5 0 - 6 %    Basophils (Auto) 1 0 - 2 %    Absolute Neutrophils (Auto) 2.1 1.8 - 7.7 K/uL    Absolute Lymphocytes (Auto) 0.5 (L) 1.0 - 4.8 K/uL    Absolute Monocytes (Auto) 0.5 0.1 - 1.0 K/uL    Absolute Eosinophils (Auto) 0.2 0.0 - 0.5 K/uL    Absolute Basophils (Auto) 0.0 0.0 - 0.2 K/uL   MAGNESIUM SERUM   Result Value Ref Range    Magnesium 1.6 1.6 - 2.5 mg/dL     ECG:  Results for orders placed or performed during the hospital encounter of 08/16/22   EKG 12-LEAD   Result Value Ref Range Status    Heart Rate 78 bpm Preliminary    RR Interval 776 ms Preliminary    Atrial Rate 77 ms Preliminary    P-R Interval 140 ms Preliminary    P Duration 111 ms Preliminary    P Horizontal Axis -4 deg Preliminary    P Front Axis 71 deg Preliminary    Q Onset 502 ms Preliminary    QRSD Interval 58 ms Preliminary    QT Interval 406 ms Preliminary    QTcB 461 ms Preliminary    QTcF 443 ms Preliminary    QRS Horizontal Axis 3 deg Preliminary    QRS Axis 52 deg Preliminary    I-40 Front Axis 65 deg Preliminary    t-40 Horizontal Axis -8 deg Preliminary    T-40 Front Axis 50 deg Preliminary    T Horizontal Axis 28 deg Preliminary    T Wave Axis 41 deg Preliminary    S-T Horizontal Axis 48 deg Preliminary    S-T Front Axis 51 deg Preliminary    Impression - OTHERWISE NORMAL ECG -  Preliminary    Impression SR-Sinus rhythm-normal P axis, V-rate 50-99  Preliminary    Impression   Preliminary     APC-Atrial premature complex-SV complex w/ short R-R interval     Medications ordered/given in the ED  Medications   potassium chloride ER (K-Dur;Klor-Con) tablet 40  mEq (40 mEq Oral Given 08/16/22 1148)   magnesium sulfate 2g/61ml (4%) in water IVPB 2 g (has no administration in time range)   oxyCODONE (Roxicodone) tablet 10 mg (10 mg Oral Given 08/16/22 1100)       Electronically signed by Carylon Perches, MD at 08/16/2022 12:29 PM EST

## 2022-08-16 NOTE — Progress Notes (Signed)
Formatting of this note might be different from the original.  Procedure Performed: PARACENTESIS  Time needle pulled: 1330  Volume removed: 5 L  Site location and dressing type: RLQ; DERMABOND, GAUZE, AND TEGADERM   Length of bedrest: N/A hours  Anticoagulation restart: Please see IR PA or physicians post procedural note.   Specific Instructions (KX:[id number]): Patient's choice/ right side down  Current vital signs: BP: 117/58  Heart Rate: 78 (08/16/22 1340)  Pulse: 83 (08/16/22 1100)  Temp: 97.9 F (36.6 C)  Resp: 17  Height: 5\' 2"  (157.5 cm)  Weight: 70.3 kg (155 lb)  BMI (Calculated): 28.34  SpO2: 99 %  Temp (24hrs), Avg:97.9 F (36.6 C), Min:97.9 F (36.6 C), Max:97.9 F (36.6 C)    PT TOLERATED PROCEDURE WELL.  Electronically signed by York Cerise at 08/16/2022  1:41 PM EST

## 2022-08-16 NOTE — ED Notes (Signed)
Formatting of this note might be different from the original.    Bedside shift report completed with Alex RN  Safety measures reviewed Yes  White board updated Yes  Lines traced/IV pump and IV site double checked Yes  Activity level, PO status and vital sign trends reviewed Yes  Monitor alarm limits on and set per order/protocol Yes  Patient and or family participated in handoff Yes   Clinical quality measures reviewedYes   Electronically signed by Miller, Kristin, RN at 08/16/2022  3:01 PM EST

## 2022-08-16 NOTE — ED Notes (Signed)
Formatting of this note might be different from the original.  Pt is a Jehovah's Witness and can not receive albumin post paracentesis.   Dr. Lina Sar aware.   Electronically signed by Jerene Dilling, RN at 08/16/2022  3:00 PM EST

## 2022-08-16 NOTE — Progress Notes (Signed)
Formatting of this note might be different from the original.  Exam explained,understood, and completed.  Electronically signed by Campbell, Laura L at 08/16/2022 10:23 AM EST

## 2022-08-16 NOTE — ED Notes (Signed)
Formatting of this note might be different from the original.    Bedside shift report completed with Cristie Hem RN  Safety measures reviewed Yes  White board updated Yes  Lines traced/IV pump and IV site double checked Yes  Activity level, PO status and vital sign trends reviewed Yes  Monitor alarm limits on and set per order/protocol Yes  Patient and or family participated in handoff Yes   Clinical quality measures reviewedYes   Electronically signed by Loura Back, RN at 08/16/2022  3:01 PM EST

## 2022-08-16 NOTE — Progress Notes (Signed)
Formatting of this note might be different from the original.  Exam explained,understood, and completed.  Electronically signed by Jearld Lesch L at 08/16/2022 10:23 AM EST

## 2022-08-16 NOTE — ED Triage Notes (Signed)
Formatting of this note might be different from the original.  Pt c/o abd distention and swelling in bilateral legs and feet. States it has been ongoing for awhile and has been seen by PCP several times for same. Had a paracentesis done in December. Denies N/V.   Electronically signed by Jeannett Senior, RN at 08/16/2022  9:40 AM EST

## 2022-08-16 NOTE — ED Notes (Signed)
Formatting of this note might be different from the original.  Pain assessment on discharge was tolerable.  Condition stable.  Patient discharged to home.  Patient education was completed:  yes  Education taught to:  patient  Teaching method used was discussion and handout.  Understanding of teaching was fair.  Patient was discharged ambulatory.  Discharged with family.  Valuables were given to: patient.   Electronically signed by Miller, Kristin, RN at 08/16/2022  3:56 PM EST

## 2022-08-16 NOTE — ED Notes (Signed)
Formatting of this note might be different from the original.  BP 94/64.  Dr. Lina Sar aware.  Pt with no further complaints.   Electronically signed by Jerene Dilling, RN at 08/16/2022  2:44 PM EST

## 2022-08-16 NOTE — ED Provider Notes (Signed)
Formatting of this note is different from the original.  ED Observation Admission    ED Observation Admission   Date/Time: 08/16/2022 at 1229    Assessment / Plan:  Paracentesis with interventional radiology    Consults while in ED Observation: Interventional radiology    Subjective:   Brianna Velazquez is a 72 y.o. 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       Objective:  Patient Vitals for the past 8 hrs:   Temp Heart Rate Pulse Resp BP BP Mean SpO2 Weight   08/16/22 1100 -- 84 83 17 126/73 88 MM HG 100 % --   08/16/22 1030 -- 78 77 10 -- -- 99 % --   08/16/22 0939 97.9 F (36.6 C) 90 -- 18 126/77 93 MM HG 99 % --   08/16/22 0938 -- -- -- -- -- -- -- 70.3 kg (155 lb)     Exam:   Constitutional:       General: She is not in acute distress.     Comments: Chronically ill-appearing elderly African-American female   HENT:      Head: Normocephalic and atraumatic.   Eyes:      Conjunctiva/sclera: Conjunctivae normal.   Cardiovascular:      Rate and Rhythm: Normal rate and regular rhythm.      Heart sounds: Normal heart sounds.   Pulmonary:      Effort: Pulmonary effort is normal. No respiratory distress.      Breath sounds: Normal breath sounds. No wheezing or rales.   Abdominal:      General: There is distension.      Tenderness: There is no abdominal tenderness.      Comments: No reproducible abdominal tenderness   Musculoskeletal:      Cervical back: Normal range of motion.      Comments: Pitting edema extending up to proximal legs bilaterally   Skin:     General: Skin is warm and dry.      Findings: No erythema or rash.   Neurological:      General: No focal deficit present.      Mental Status: She is alert.   Psychiatric:         Mood and Affect: Mood normal.     Independently reviewed lab and imaging results from ED course:  Results for orders  placed or performed during the hospital encounter of 08/16/22   Chem8, i-STAT (Lab)   Result Value Ref Range    POTASSIUM 3.0 (L) 3.5 - 5.5 mmol/L    CHLORIDE 94 (L) 98 - 110 mmol/L    CALCIUM IONIZED 4.5 4.4 - 5.4 mg/dL    CO2 37.0 (H) 20.0 - 32.0 mmol/L    Glucose 77 70 - 99 mg/dL    BUN 9 6 - 22 mg/dL    CREATININE 0.7 (L) 0.8 - 1.4 mg/dL    SODIUM 141 133 - 145 mmol/L    HGB 13.9 11.7 - 16.1 g/dL    HCT 41.0 35.1 - 48.3 %    POC-eGFR >60.0 >60.0    Cartridge type XLKG4+    BASIC METABOLIC PANEL   Result Value Ref Range    Potassium 2.9 (LL) 3.5 - 5.5 mmol/L    Sodium 139 133 - 145 mmol/L    Chloride 98 98 - 110 mmol/L  Glucose 75 70 - 99 mg/dL    Calcium 8.5 8.4 - 10.5 mg/dL    BUN 9 6 - 22 mg/dL    Creatinine 0.4 (L) 0.8 - 1.4 mg/dL    CO2 32 20 - 32 mmol/L    eGFR >60.0 >60.0 mL/min/1.73 sq.m.    Anion Gap 9.0 3.0 - 15.0 mmol/L   HEPATIC FUNCTION PANEL   Result Value Ref Range    Albumin 2.7 (L) 3.5 - 5.0 g/dL    Total Protein 7.4 6.2 - 8.1 g/dL    Globulin 4.7 (H) 2.0 - 4.0 g/dL    A/G Ratio 0.6 (L) 1.1 - 2.6 ratio    Bilirubin Total 2.7 (H) 0.2 - 1.2 mg/dL    Bilirubin Direct 1.2 (H) 0.0 - 0.3 mg/dL    SGOT (AST) 67 (H) 10 - 37 U/L    Alkaline Phosphatase 214 (H) 40 - 120 U/L    SGPT (ALT) 24 5 - 40 U/L   PT-INR   Result Value Ref Range    Protime 15.5 (H) 9.0 - 13.0 sec    INR 1.43 (H) 0.89 - 1.29   NT PROBNP   Result Value Ref Range    NT proBNP 157 (H) <=125 pg/mL   CBC WITH DIFFERENTIAL AUTO   Result Value Ref Range    WBC 3.3 (L) 4.0 - 11.0 K/uL    RBC 3.70 (L) 3.80 - 5.20 M/uL    HGB 12.7 11.7 - 16.1 g/dL    HCT 35.5 35.1 - 48.3 %    MCV 96 80 - 99 fL    MCH 34 26 - 34 pg    MCHC 36 31 - 36 g/dL    RDW 13.9 10.0 - 15.5 %    Platelet 68 (L) 140 - 440 K/uL    MPV 10.4 9.0 - 13.0 fL    Immature Platelet Fraction 3.6 1.1 - 7.1 %    Segmented Neutrophils (Auto) 65 40 - 75 %    Lymphocytes (Auto) 16 (L) 20 - 45 %    Monocytes (Auto) 14 (H) 3 - 12 %    Eosinophils (Auto) 5 0 - 6 %    Basophils (Auto) 1 0 -  2 %    Absolute Neutrophils (Auto) 2.1 1.8 - 7.7 K/uL    Absolute Lymphocytes (Auto) 0.5 (L) 1.0 - 4.8 K/uL    Absolute Monocytes (Auto) 0.5 0.1 - 1.0 K/uL    Absolute Eosinophils (Auto) 0.2 0.0 - 0.5 K/uL    Absolute Basophils (Auto) 0.0 0.0 - 0.2 K/uL   MAGNESIUM SERUM   Result Value Ref Range    Magnesium 1.6 1.6 - 2.5 mg/dL     Results for orders placed or performed during the hospital encounter of 08/16/22   EKG 12-LEAD   Result Value Ref Range Status    Heart Rate 78 bpm Preliminary    RR Interval 776 ms Preliminary    Atrial Rate 77 ms Preliminary    P-R Interval 140 ms Preliminary    P Duration 111 ms Preliminary    P Horizontal Axis -4 deg Preliminary    P Front Axis 71 deg Preliminary    Q Onset 502 ms Preliminary    QRSD Interval 58 ms Preliminary    QT Interval 406 ms Preliminary    QTcB 461 ms Preliminary    QTcF 443 ms Preliminary    QRS Horizontal Axis 3 deg Preliminary    QRS Axis 52 deg Preliminary  I-40 Front Axis 65 deg Preliminary    t-40 Horizontal Axis -8 deg Preliminary    T-40 Front Axis 50 deg Preliminary    T Horizontal Axis 28 deg Preliminary    T Wave Axis 41 deg Preliminary    S-T Horizontal Axis 48 deg Preliminary    S-T Front Axis 51 deg Preliminary    Impression - OTHERWISE NORMAL ECG -  Preliminary    Impression SR-Sinus rhythm-normal P axis, V-rate 50-99  Preliminary    Impression   Preliminary     APC-Atrial premature complex-SV complex w/ short R-R interval     EKG 12-LEAD       CHEST PORTABLE   Final Result     No acute cardiopulmonary process.     Signed By: Maciej S Tobola, MD on 08/16/2022 12:06 PM       PARACENTESIS BY RADIOLOGY    (Results Pending)     Electronically signed by Simms, Andrea, MD at 08/16/2022 12:29 PM EST

## 2022-08-16 NOTE — ED Notes (Signed)
Formatting of this note might be different from the original.  Pain assessment on discharge was tolerable.  Condition stable.  Patient discharged to home.  Patient education was completed:  yes  Education taught to:  patient  Teaching method used was discussion and handout.  Understanding of teaching was fair.  Patient was discharged ambulatory.  Discharged with family.  Valuables were given to: patient.   Electronically signed by Loura Back, RN at 08/16/2022  3:56 PM EST

## 2022-08-16 NOTE — ED Notes (Signed)
Formatting of this note might be different from the original.  Pt received from triage.  Pt alert and oriented x4.  Pt states she has generalized pain related to abdominal pain/distention. Denies any nausea/vomiting.  Pt states she has had to have fluid drained from abdomen in the past.  Pt has been having increased pain x about a month.  VS stable. Call bell within reach.   Electronically signed by Wolter, Alexandra M, RN at 08/16/2022  2:42 PM EST

## 2022-08-16 NOTE — ED Provider Notes (Signed)
Formatting of this note is different from the original.    [facility]    Time of Arrival:   08/16/22 1610    Final diagnoses:   [K74.60, R18.8] Cirrhosis of liver with ascites, unspecified hepatic cirrhosis type ([place]) (Primary)   [E87.6] Hypokalemia   [E83.42] Hypomagnesemia     Medical Decision Making:    Eadie Repetto is a 72 y.o. 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      Differential Diagnosis:     Symptoms likely due to ascites from cirrhosis, however, will evaluate for CHF, PNA, electrolyte abnormality    Patient presented to the ED afebrile, hemodynamically stable, satting well on room air  EKG with sinus rhythm, rate of 78, PAC appreciated, unchanged compared to prior  Limited bedside ultrasound performed by this examiner with evidence of ascites  Will perform basic labs including hepatic panel, INR, BNP as well as CXR  Will contact interventional radiology for paracentesis to be performed  Will give dose of oxycodone in the ED per patient request, reassess    Social Determinants of Health:  social factors reviewed, did not limit treatment                       Glasgow Coma Scale Score: 15          Supplemental Historians include:  patient and medical records    ED Course:   Basic labs significant for leukopenia to 3.3, hypokalemia to 3.0, mild transaminitis with AST of 67 (decreased compared to prior), elevated total bilirubin to 2.7, direct bilirubin to 1.2, alkaline phosphatase 08/30/2012, mildly elevated BNP to 157 (similar compared to prior), otherwise within normal limits  CXR with no evidence of infiltrate or effusion    Patient was ordered for 40 mEq p.o. potassium x 2 as well as 2 g of magnesium sulfate IV    Interventional radiology contacted.  Paracentesis orders placed    Will place  patient on ED observation status with plans for patient to have paracentesis performed prior to disposition.        Documentation/Prior Results Review:  Old medical records, Previous electrocardiograms, Nursing notes, Previous radiology studies    Rhythm interpretation from monitor: normal sinus rhythm    Imaging Interpreted by me: X-Ray chest with no obvious infiltrate on my read    EKG 12-LEAD       CHEST PORTABLE   Final Result     No acute cardiopulmonary process.     Signed By: Duanne Guess, MD on 08/16/2022 12:06 PM       PARACENTESIS BY RADIOLOGY    (Results Pending)     .     Discussion of Management with other Physicians, QHP or Appropriate Source:   Consultant interventional radiology    .    Disposition:  ED Observation    New Prescriptions    No medications on file     Chief Complaint   Patient presents with    ABDOMINAL DISTENTION    LEG SWELLING     Keighley Deckman is a 72 y.o. 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  Patient reports history of similar  symptoms and states her last paracentesis was on 07/24/2022.  Patient reports pain in the area that she rates a 10/10 and describes as tightness.  Patient denies any radiation of the pain.  Patient denies taking medication for the pain.  Patient also reports intermittent shortness of breath, bilateral lower extremity swelling which improves with elevating her legs and is exacerbated by prolonged standing.  Patient denies any fever, chills, abnormal cough, nausea, vomiting, diarrhea, blood in stool.  Patient denies any anticoagulant use.  Patient reports a scheduled colonoscopy for next month.        Review of Systems:  Constitutional:  Negative for fever and chills.   HENT:  Negative for sore throat.    Eyes:  Negative for visual disturbance.   Respiratory:  Positive for  shortness of breath. Negative for cough.    Cardiovascular:  Positive for leg swelling.   Gastrointestinal:  Positive for abdominal pain. Negative for nausea and vomiting.   Genitourinary:  Negative for dysuria and hematuria.   Musculoskeletal:  Negative for back pain and joint swelling.   Skin:  Negative for rash.   Neurological:  Negative for numbness and headaches.     Physical Exam  Vitals and nursing note reviewed.   Constitutional:       General: She is not in acute distress.     Comments: Chronically ill-appearing elderly African-American female   HENT:      Head: Normocephalic and atraumatic.   Eyes:      Conjunctiva/sclera: Conjunctivae normal.   Cardiovascular:      Rate and Rhythm: Normal rate and regular rhythm.      Heart sounds: Normal heart sounds.   Pulmonary:      Effort: Pulmonary effort is normal. No respiratory distress.      Breath sounds: Normal breath sounds. No wheezing or rales.   Abdominal:      General: There is distension.      Tenderness: There is no abdominal tenderness.      Comments: No reproducible abdominal tenderness   Musculoskeletal:      Cervical back: Normal range of motion.      Comments: Pitting edema extending up to proximal legs bilaterally   Skin:     General: Skin is warm and dry.      Findings: No erythema or rash.   Neurological:      General: No focal deficit present.      Mental Status: She is alert.   Psychiatric:         Mood and Affect: Mood normal.     Past Medical History:   Diagnosis Date    Cirrhosis of liver (HCC)      No past surgical history on file.  No family history on file.  Social History     Occupational History    Not on file   Tobacco Use    Smoking status: Not on file    Smokeless tobacco: Not on file   Substance and Sexual Activity    Alcohol use: Not on file    Drug use: Not on file    Sexual activity: Not on file     No outpatient medications have been marked as taking for the 08/16/22 encounter [facility] Encounter).     Allergies   Allergen  Reactions    Asprin Ec Low Dose [Aspirin] hives     Vital Signs:  Patient Vitals for the past 72 hrs:   Temp Heart Rate Pulse Resp BP BP  Mean SpO2 Weight   08/16/22 1100 -- 84 83 17 126/73 88 MM HG 100 % --   08/16/22 1030 -- 78 77 10 -- -- 99 % --   08/16/22 0939 97.9 F (36.6 C) 90 -- 18 126/77 93 MM HG 99 % --   08/16/22 0938 -- -- -- -- -- -- -- 70.3 kg (155 lb)     Diagnostics:  Labs:    Results for orders placed or performed during the hospital encounter of 08/16/22   Chem8, i-STAT (Lab)   Result Value Ref Range    POTASSIUM 3.0 (L) 3.5 - 5.5 mmol/L    CHLORIDE 94 (L) 98 - 110 mmol/L    CALCIUM IONIZED 4.5 4.4 - 5.4 mg/dL    CO2 37.0 (H) 20.0 - 32.0 mmol/L    Glucose 77 70 - 99 mg/dL    BUN 9 6 - 22 mg/dL    CREATININE 0.7 (L) 0.8 - 1.4 mg/dL    SODIUM 141 133 - 145 mmol/L    HGB 13.9 11.7 - 16.1 g/dL    HCT 41.0 35.1 - 48.3 %    POC-eGFR >60.0 >60.0    Cartridge type [id number]+    BASIC METABOLIC PANEL   Result Value Ref Range    Potassium 2.9 (LL) 3.5 - 5.5 mmol/L    Sodium 139 133 - 145 mmol/L    Chloride 98 98 - 110 mmol/L    Glucose 75 70 - 99 mg/dL    Calcium 8.5 8.4 - 10.5 mg/dL    BUN 9 6 - 22 mg/dL    Creatinine 0.4 (L) 0.8 - 1.4 mg/dL    CO2 32 20 - 32 mmol/L    eGFR >60.0 >60.0 mL/min/1.73 sq.m.    Anion Gap 9.0 3.0 - 15.0 mmol/L   HEPATIC FUNCTION PANEL   Result Value Ref Range    Albumin 2.7 (L) 3.5 - 5.0 g/dL    Total Protein 7.4 6.2 - 8.1 g/dL    Globulin 4.7 (H) 2.0 - 4.0 g/dL    A/G Ratio 0.6 (L) 1.1 - 2.6 ratio    Bilirubin Total 2.7 (H) 0.2 - 1.2 mg/dL    Bilirubin Direct 1.2 (H) 0.0 - 0.3 mg/dL    SGOT (AST) 67 (H) 10 - 37 U/L    Alkaline Phosphatase 214 (H) 40 - 120 U/L    SGPT (ALT) 24 5 - 40 U/L   PT-INR   Result Value Ref Range    Protime 15.5 (H) 9.0 - 13.0 sec    INR 1.43 (H) 0.89 - 1.29   NT PROBNP   Result Value Ref Range    NT proBNP 157 (H) <=125 pg/mL   CBC WITH DIFFERENTIAL AUTO   Result Value Ref Range    WBC 3.3 (L) 4.0 - 11.0 K/uL    RBC 3.70 (L) 3.80 - 5.20 M/uL    HGB 12.7  11.7 - 16.1 g/dL    HCT 35.5 35.1 - 48.3 %    MCV 96 80 - 99 fL    MCH 34 26 - 34 pg    MCHC 36 31 - 36 g/dL    RDW 13.9 10.0 - 15.5 %    Platelet 68 (L) 140 - 440 K/uL    MPV 10.4 9.0 - 13.0 fL    Immature Platelet Fraction 3.6 1.1 - 7.1 %    Segmented Neutrophils (Auto) 65 40 - 75 %    Lymphocytes (Auto) 16 (L) 20 - 45 %  Monocytes (Auto) 14 (H) 3 - 12 %    Eosinophils (Auto) 5 0 - 6 %    Basophils (Auto) 1 0 - 2 %    Absolute Neutrophils (Auto) 2.1 1.8 - 7.7 K/uL    Absolute Lymphocytes (Auto) 0.5 (L) 1.0 - 4.8 K/uL    Absolute Monocytes (Auto) 0.5 0.1 - 1.0 K/uL    Absolute Eosinophils (Auto) 0.2 0.0 - 0.5 K/uL    Absolute Basophils (Auto) 0.0 0.0 - 0.2 K/uL   MAGNESIUM SERUM   Result Value Ref Range    Magnesium 1.6 1.6 - 2.5 mg/dL     ECG:  Results for orders placed or performed during the hospital encounter of 08/16/22   EKG 12-LEAD   Result Value Ref Range Status    Heart Rate 78 bpm Preliminary    RR Interval 776 ms Preliminary    Atrial Rate 77 ms Preliminary    P-R Interval 140 ms Preliminary    P Duration 111 ms Preliminary    P Horizontal Axis -4 deg Preliminary    P Front Axis 71 deg Preliminary    Q Onset 502 ms Preliminary    QRSD Interval 58 ms Preliminary    QT Interval 406 ms Preliminary    QTcB 461 ms Preliminary    QTcF 443 ms Preliminary    QRS Horizontal Axis 3 deg Preliminary    QRS Axis 52 deg Preliminary    I-40 Front Axis 65 deg Preliminary    t-40 Horizontal Axis -8 deg Preliminary    T-40 Front Axis 50 deg Preliminary    T Horizontal Axis 28 deg Preliminary    T Wave Axis 41 deg Preliminary    S-T Horizontal Axis 48 deg Preliminary    S-T Front Axis 51 deg Preliminary    Impression - OTHERWISE NORMAL ECG -  Preliminary    Impression SR-Sinus rhythm-normal P axis, V-rate 50-99  Preliminary    Impression   Preliminary     APC-Atrial premature complex-SV complex w/ short R-R interval     Medications ordered/given in the ED  Medications   potassium chloride ER (K-Dur;Klor-Con) tablet 40  mEq (40 mEq Oral Given 08/16/22 1148)   magnesium sulfate 2g/61ml (4%) in water IVPB 2 g (has no administration in time range)   oxyCODONE (Roxicodone) tablet 10 mg (10 mg Oral Given 08/16/22 1100)       Electronically signed by Carylon Perches, MD at 08/16/2022 12:29 PM EST

## 2022-08-16 NOTE — ED Triage Notes (Signed)
Formatting of this note might be different from the original.  Pt c/o abd distention and swelling in bilateral legs and feet. States it has been ongoing for awhile and has been seen by PCP several times for same. Had a paracentesis done in December. Denies N/V.   Electronically signed by Nipper, Savannah, RN at 08/16/2022  9:40 AM EST

## 2022-08-16 NOTE — ED Notes (Signed)
Formatting of this note might be different from the original.  Pt returned from IR in stable condition. Pt states she is feeling much better. Abdomen less distended.  VS stable.    Electronically signed by Wolter, Alexandra M, RN at 08/16/2022  2:43 PM EST

## 2022-09-04 ENCOUNTER — Encounter: Payer: MEDICARE | Attending: Student in an Organized Health Care Education/Training Program | Primary: Family

## 2022-09-13 ENCOUNTER — Encounter: Payer: MEDICARE | Attending: Family | Primary: Family

## 2022-09-26 ENCOUNTER — Ambulatory Visit: Payer: MEDICARE | Attending: Critical Care Medicine | Primary: Family

## 2022-09-26 ENCOUNTER — Ambulatory Visit: Payer: MEDICARE | Primary: Family

## 2022-10-02 NOTE — Progress Notes (Signed)
Instructions for your procedure at [facility] Date: 10/02/2022      Patient's Name: Brianna Velazquez      Procedure Date: October 09, 2022        Please enter the main entrance of the hospital and check-in at the front desk located in the lobby.      Do NOT eat or drink anything, including candy, gum, or ice chips after midnight prior to your procedure, unless it is part of your prep.  Brush your teeth before coming to the hospital.You may swish with water, but do not swallow.  No smoking/Vaping/E-Cigarettes 24 hours prior to the day of procedure.  No alcohol 24 hours prior to the day of procedure.  No recreational drugs for one week prior to the day of procedure.  Bring Photo ID, Insurance information, and Co-pay if required on day of procedure.  Bring in pertinent legal documents, such as, Medical Power of [place], DNR, Advance Directive, etc.  Leave all other valuables, including money/purse, at home.  Remove jewelry, including ALL body piercings, nail polish, acrylic nails, and makeup (including mascara); no lotions, powders, deodorant, and/or perfume/cologne/after shave on the skin.  Glasses and dentures may be worn to the hospital.  They must be removed prior to procedure. Please bring case/container for glasses or dentures.  11. Contacts should not be worn on day of procedure.   12. Call the office [phone]) if you have symptoms of a cold or illness within 24-48 hours prior to your procedure.   13. AN ADULT (relative or friend 18 years or older) MUST DRIVE YOU HOME AFTER YOUR PROCEDURE.   14. Please make arrangements for a responsible adult (18 years or older) to be with you for 24 hours after your procedure.   69. ONE VISITOR will be allowed in the waiting area during your procedure.       Special Instructions:      Bring list of CURRENT medications.  Follow instructions from the office regarding Bowel Prep, Vitamins, Iron, Blood Thinners, Insulin, Seizure, and  Blood Pressure/Heart medications.  Bring inhaler.      Any questions regarding prep, please call the office at [phone].    For any questions or concerns on the day of procedure, please call the [facility] at [phone].    These surgical instructions were reviewed with Herbert Pun and Luellen Pucker during the PAT phone call.

## 2022-10-02 NOTE — Procedures (Signed)
Formatting of this note might be different from the original.  Images from the original note were not included.      Brief Procedure Note    Procedure(s) Performed: Ultrasound guided Paracentesis    Procedure Performed By: Alleen Borne, PA    Attending Physician: Vista Mink MD    Supervision: General    Pre-operative Diagnosis: Ascites, therapeutic     Post-operative Diagnosis: Same    Specimens: No    Estimated Blood Loss:  None    Complications: None    Anesthesia: Local     Findings:Ultrasound image captured and saved, site left lower quadrant, large volume of fluid removed, please see dictated report for details.    Disposition: [facility]  Vascular & Interventional Radiology  10/02/2022     Angio/IR at [facility]: [id number]  (after hours [phone])  Angio/IR at [facility]:    [phone]  (after hours [phone])  Angio/IR at [facility]:      [id number]  (after hours [id number])    Electronically signed by Irena Cords, MD at 10/02/2022  4:00 PM EST

## 2022-10-02 NOTE — Progress Notes (Signed)
Formatting of this note might be different from the original.  Procedure Performed: paracentesis  Time needle pulled: 0850  Volume removed: 4 liters  Site location and dressing type: llq gauze and tegaderm   Length of bedrest: n/a hours  Anticoagulation restart: Please see IR PA or physicians post procedural note.   Specific Instructions ([id number]): Patient's choice  Current vital signs: BP: 137/65  Heart Rate: 73 (10/02/22 0721)  Pulse: 76 (10/02/22 0900)  Temp: 97 F (36.1 C)  Resp: 18  Height: '5\' 2"'$  (157.5 cm)  Weight: 62.4 kg (137 lb 9.6 oz)  BMI (Calculated): 25.16  SpO2: 100 %  Temp (24hrs), Avg:97 F (36.1 C), Min:97 F (36.1 C), Max:97 F (36.1 C)    Electronically signed by Kristine Garbe B at 10/02/2022  9:01 AM EST

## 2022-10-06 DIAGNOSIS — A419 Sepsis, unspecified organism: Secondary | ICD-10-CM

## 2022-10-06 DIAGNOSIS — K922 Gastrointestinal hemorrhage, unspecified: Secondary | ICD-10-CM

## 2022-10-06 NOTE — Other (Signed)
Informed Consent for Blood Component Transfusion Note    I have discussed with the son the rationale for blood component transfusion; its benefits in treating or preventing fatigue, organ damage, or death; and its risk which includes mild transfusion reactions, rare risk of blood borne infection, or more serious but rare reactions. I have discussed the alternatives to transfusion, including the risk and consequences of not receiving transfusion. The son had an opportunity to ask questions and had agreed to proceed with transfusion of blood components.    Electronically signed by Mikle Bosworth, MD on 10/07/22 at 4:56 AM EDT

## 2022-10-06 NOTE — ED Provider Notes (Shared)
EMERGENCY DEPARTMENT HISTORY AND PHYSICAL EXAM      Patient Name: Brianna Velazquez  MRN: [id number]  Birth Date: 02/23/1951  Provider: Geraldo Pitter, MD  PCP: Randel Books., APRN - NP   Time/Date of evaluation: 12:36 AM EDT on 10/07/22    History of Presenting Illness     Chief Complaint   Patient presents with    GI Bleeding    Rectal Bleeding       History Provided By: Patient     History Brianna Velazquez):   Brianna Velazquez is a 72 y.o. female with a PMHX of cirrhosis of the liver with ascites, esophageal varices, GERD, hepatic encephalopathy, hepatitis C, hypertension, hypokalemia, hypomagnesemia obstructive sleep apnea, schizoaffective disorder, and thrombocytopenia  who presents to the emergency department  by EMS C/O nausea and vomiting of bright red blood that then turned dark and was coffee-ground's.  She also states that she saw blood in her stool.  She states that she had diarrhea this morning and saw bright red blood.    The patient is a great historian at this time.  I was able to ascertain that she does not take aspirin.  She was not able to tell me whether or not she takes blood thinners.    We did speak to patient's family member.  They reported that she has recurrent ascites and has up to 6 L drained off at a time.        Past History     Past Medical History:  Past Medical History:   Diagnosis Date    Ascites     Asthma     Back pain     Cirrhosis of liver (HCC)     Depression     Esophageal varices with bleeding in diseases classified elsewhere [facility])     GERD (gastroesophageal reflux disease)     Hepatic encephalopathy (HCC)     Hepatitis C     treated with Dorita Fray 08/2020 per liver specialist note 01/17/21 care everywhere    Hypertension     Hypokalemia     Hypomagnesemia     OSA (obstructive sleep apnea)     no cpap    Schizoaffective disorder (HCC)     Thrombocytopenia (HCC)     Venous insufficiency        Past Surgical History:  Past Surgical History:   Procedure Laterality Date    BACK SURGERY       part of hip removed right hip for back    COLONOSCOPY      ESOPHAGOGASTRODUODENOSCOPY      HYSTERECTOMY (CERVIX STATUS UNKNOWN)      KNEE ARTHROSCOPY Left     PARACENTESIS  10/02/2022    TUBAL LIGATION         Family History:  No family history on file.    Social History:  Social History     Tobacco Use    Smoking status: Former     Current packs/day: 0.25     Types: Cigarettes    Smokeless tobacco: Never    Tobacco comments:     Stopped 1996     Smoked 25 years   Vaping Use    Vaping Use: Never used   Substance Use Topics    Alcohol use: Not Currently    Drug use: Never       Medications:  Current Facility-Administered Medications   Medication Dose Route Frequency Provider Last Rate Last Admin    norepinephrine (LEVOPHED) 16 mg  in sodium chloride 0.9 % 250 mL infusion  1-100 mcg/min IntraVENous Continuous Katelynne Revak L, MD 8.4 mL/hr at 10/07/22 0357 9 mcg/min at 10/07/22 0357    octreotide (SANDOSTATIN) 500 mcg in sodium chloride 0.9 % 100 mL infusion  25 mcg/hr IntraVENous Continuous Tonie Elsey L, MD        0.9 % sodium chloride infusion   IntraVENous PRN Matasha Smigelski L, MD        cefTRIAXone (ROCEPHIN) 1,000 mg in sterile water 10 mL IV syringe  1,000 mg IntraVENous Q24H Korayma Hagwood L, MD   1,000 mg at 10/07/22 0105    pantoprazole (PROTONIX) 40 mg in sodium chloride 0.9 % 50 mL (mini-bag) infusion  8 mg/hr IntraVENous Continuous Vista Lawman, MD 10 mL/hr at 10/07/22 0117 8 mg/hr at 10/07/22 0117    vancomycin (VANCOCIN) intermittent dosing (placeholder)   Other RX Placeholder Vista Lawman, MD         Current Outpatient Medications   Medication Sig Dispense Refill    spironolactone (ALDACTONE) 50 MG tablet Take 1 tablet by mouth daily      fluticasone (FLONASE) 50 MCG/ACT nasal spray 1 spray by Nasal route daily as needed      XIFAXAN 550 MG tablet Take 1 tablet by mouth 2 times daily 42 tablet 3    propranolol (INDERAL) 10 MG tablet Take 1 tablet by mouth nightly      pantoprazole (PROTONIX) 40 MG tablet  Take 1 tablet by mouth every morning      montelukast (SINGULAIR) 10 MG tablet Take 1 tablet by mouth as needed      furosemide (LASIX) 40 MG tablet Take 1 tablet by mouth daily      QVAR REDIHALER 40 MCG/ACT AERB inhaler as needed      albuterol (ACCUNEB) 1.25 MG/3ML nebulizer solution Inhale 3 mLs into the lungs as needed      albuterol sulfate HFA (PROVENTIL;VENTOLIN;PROAIR) 108 (90 Base) MCG/ACT inhaler USE 1 INHALATION EVERY 4 TO 6 HOURS AS NEEDED         Allergies:  Allergies   Allergen Reactions    Aspirin Anaphylaxis and Itching       Social Determinants of Health:  Social Determinants of Health     Tobacco Use: Medium Risk (10/02/2022)    Patient History     Smoking Tobacco Use: Former     Smokeless Tobacco Use: Never     Passive Exposure: Not on file   Alcohol Use: Not At Risk (03/21/2022)    AUDIT-C     Frequency of Alcohol Consumption: Never     Average Number of Drinks: Patient does not drink     Frequency of Binge Drinking: Never   Financial Resource Strain: High Risk (02/21/2022)    Overall Financial Resource Strain (CARDIA)     Difficulty of Paying Living Expenses: Hard   Food Insecurity: Not on file (02/21/2022)   Recent Concern: Food Insecurity - Food Insecurity Present (02/21/2022)    Hunger Vital Sign     Worried About Running Out of Food in the Last Year: Sometimes true     Ran Out of Food in the Last Year: Sometimes true   Transportation Needs: Unmet Transportation Needs (02/21/2022)    PRAPARE - Therapist, art (Medical): Not on file     Lack of Transportation (Non-Medical): Yes   Physical Activity: Inactive (03/21/2022)    Exercise Vital Sign     Days of Exercise  per Week: 0 days     Minutes of Exercise per Session: 0 min   Stress: Not on file   Social Connections: Not on file   Intimate Partner Violence: Not on file   Depression: At risk (03/21/2022)    PHQ-2     PHQ-2 Score: 13   Housing Stability: Unknown (02/21/2022)    Housing Stability Vital Sign     Unable to Pay for  Housing in the Last Year: Not on file     Number of Places Lived in the Last Year: Not on file     Unstable Housing in the Last Year: No   Interpersonal Safety: Not on file   Utilities: Not on file       Review of Systems     Negative except as listed above in HPI.    Physical Exam     Vitals:    10/07/22 0315 10/07/22 0330 10/07/22 0345 10/07/22 0400   BP: (!) 102/53 (!) 114/50 (!) 99/46 (!) 107/45   Pulse: (!) 106 (!) 119 (!) 106 (!) 103   Resp: 17 18 18 13    Temp:       TempSrc:       SpO2: 100% 100% 100% 100%   Weight:       Height:           Physical Exam  Vitals and nursing note reviewed.   Constitutional:       General: She is in acute distress.      Appearance: She is ill-appearing.   HENT:      Head: Normocephalic and atraumatic.      Right Ear: External ear normal.      Left Ear: External ear normal.      Nose: Nose normal.      Mouth/Throat:      Mouth: Mucous membranes are dry.      Comments: Dried coffee-ground emesis on lips  Eyes:      Extraocular Movements: Extraocular movements intact.      Conjunctiva/sclera: Conjunctivae normal.      Pupils: Pupils are equal, round, and reactive to light.   Cardiovascular:      Rate and Rhythm: Regular rhythm. Tachycardia present.      Pulses: Normal pulses.      Heart sounds:      Gallop present.   Pulmonary:      Effort: Pulmonary effort is normal.      Breath sounds: Normal breath sounds.   Abdominal:      General: Bowel sounds are normal. There is distension.      Palpations: Abdomen is soft.   Musculoskeletal:         General: Normal range of motion.      Cervical back: Normal range of motion and neck supple.      Right lower leg: No edema.      Left lower leg: No edema.   Skin:     Capillary Refill: Capillary refill takes 2 to 3 seconds.      Coloration: Skin is pale.      Comments: Cool and pale skin   Neurological:      Comments: Awake and alert but not really able to answer many questions.  Moving all extremities equally.   Psychiatric:      Comments:  Very anxious           Diagnostic Study Results     Labs:  Recent Results (from the past 12 hour(s))  CBC with Auto Differential    Collection Time: Nov 05, 2022 10:19 PM   Result Value Ref Range    WBC 16.9 (H) 4.6 - 13.2 K/uL    RBC 2.41 (L) 4.20 - 5.30 M/uL    Hemoglobin 8.7 (L) 12.0 - 16.0 g/dL    Hematocrit 13.0 (L) 35.0 - 45.0 %    MCV 110.0 (H) 78.0 - 100.0 FL    MCH 36.1 (H) 24.0 - 34.0 PG    MCHC 32.8 31.0 - 37.0 g/dL    RDW 86.5 (H) 78.4 - 14.5 %    Platelets 63 (L) 135 - 420 K/uL    MPV 11.9 (H) 9.2 - 11.8 FL    Nucleated RBCs 2.0 (H) 0 PER 100 WBC    nRBC 0.34 (H) 0.00 - 0.01 K/uL    Neutrophils % 88 (H) 40 - 73 %    Lymphocytes % 4 (L) 21 - 52 %    Monocytes % 7 3 - 10 %    Eosinophils % 0 0 - 5 %    Basophils % 0 0 - 2 %    Immature Granulocytes 1 (H) 0.0 - 0.5 %    Neutrophils Absolute 14.9 (H) 1.8 - 8.0 K/UL    Lymphocytes Absolute 0.7 (L) 0.9 - 3.6 K/UL    Monocytes Absolute 1.1 0.05 - 1.2 K/UL    Eosinophils Absolute 0.0 0.0 - 0.4 K/UL    Basophils Absolute 0.0 0.0 - 0.1 K/UL    Absolute Immature Granulocyte 0.2 (H) 0.00 - 0.04 K/UL    Differential Type AUTOMATED     CMP    Collection Time: 11/05/22 10:19 PM   Result Value Ref Range    Sodium 144 136 - 145 mmol/L    Potassium 4.0 3.5 - 5.5 mmol/L    Chloride 100 100 - 111 mmol/L    CO2 18 (L) 21 - 32 mmol/L    Anion Gap 26 (H) 3.0 - 18 mmol/L    Glucose 39 (LL) 74 - 99 mg/dL    BUN 41 (H) 7.0 - 18 MG/DL    Creatinine 6.96 (H) 0.6 - 1.3 MG/DL    Bun/Cre Ratio 14 12 - 20      Est, Glom Filt Rate 17 (L) >60 ml/min/1.42m2    Calcium 8.8 8.5 - 10.1 MG/DL    Total Bilirubin 3.6 (H) 0.2 - 1.0 MG/DL    ALT 53 13 - 56 U/L    AST 117 (H) 10 - 38 U/L    Alk Phosphatase 108 45 - 117 U/L    Total Protein 5.8 (L) 6.4 - 8.2 g/dL    Albumin 1.9 (L) 3.4 - 5.0 g/dL    Globulin 3.9 2.0 - 4.0 g/dL    Albumin/Globulin Ratio 0.5 (L) 0.8 - 1.7     ETOH    Collection Time: Nov 05, 2022 10:19 PM   Result Value Ref Range    Ethanol Lvl <3 0 - 3 MG/DL   Lipase    Collection Time:  11/05/22 10:19 PM   Result Value Ref Range    Lipase 35 13 - 75 U/L   TYPE AND SCREEN    Collection Time: 11-05-2022 10:19 PM   Result Value Ref Range    Crossmatch expiration date 10/09/2022,2359     ABO/Rh O POSITIVE     Antibody Screen NEG     Unit Number [id number]     Product Code Blood Bank RC LR     Unit Divison 00  Dispense Status Blood Bank ALLOCATED     Crossmatch Result Compatible     Unit Number [id number]     Product Code Blood Bank RC LR     Unit Divison 00     Dispense Status Blood Bank ALLOCATED     Crossmatch Result Compatible    Protime-INR    Collection Time: 10-30-22 10:19 PM   Result Value Ref Range    Protime 31.7 (H) 11.9 - 14.7 sec    INR 3.0 (H) 0.9 - 1.1     APTT    Collection Time: 2022-10-30 10:19 PM   Result Value Ref Range    APTT 40.8 (H) 23.0 - 36.4 SEC   TSH    Collection Time: 2022-10-30 10:19 PM   Result Value Ref Range    TSH, 3RD GENERATION 3.80 (H) 0.36 - 3.74 uIU/mL   T4, Free    Collection Time: 2022/10/30 10:19 PM   Result Value Ref Range    T4 Free 1.3 0.7 - 1.5 NG/DL   EKG 12 Lead    Collection Time: 10/30/2022 10:30 PM   Result Value Ref Range    Ventricular Rate 80 BPM    Atrial Rate 80 BPM    P-R Interval 114 ms    QRS Duration 72 ms    Q-T Interval 450 ms    QTc Calculation (Bazett) 519 ms    P Axis 63 degrees    R Axis 60 degrees    T Axis 36 degrees    Diagnosis       Sinus rhythm with premature supraventricular complexes  ST abnormality, possible digitalis effect  Prolonged QT  Abnormal ECG  No previous ECGs available     POCT Glucose    Collection Time: Oct 30, 2022 10:49 PM   Result Value Ref Range    POC Glucose <20.0 (LL) 70 - 110 mg/dL   POCT Glucose    Collection Time: Oct 30, 2022 10:56 PM   Result Value Ref Range    POC Glucose 43 (LL) 70 - 110 mg/dL   Blood Culture 2    Collection Time: 10/30/22 11:00 PM    Specimen: Blood   Result Value Ref Range    Special Requests NO SPECIAL REQUESTS      Culture PENDING    Blood Culture 1    Collection Time: 2022-10-30 11:20 PM     Specimen: Blood   Result Value Ref Range    Special Requests NO SPECIAL REQUESTS      Culture PENDING    POCT Glucose    Collection Time: 10/30/2022 11:26 PM   Result Value Ref Range    POC Glucose 20 (LL) 70 - 110 mg/dL   Lactic Acid    Collection Time: Oct 30, 2022 11:30 PM   Result Value Ref Range    Lactic Acid, Plasma 19.8 (HH) 0.4 - 2.0 MMOL/L   POCT Glucose    Collection Time: 10/07/22 12:26 AM   Result Value Ref Range    POC Glucose 151 (H) 70 - 110 mg/dL   POCT Glucose    Collection Time: 10/07/22 12:51 AM   Result Value Ref Range    POC Glucose 139 (H) 70 - 110 mg/dL   Ammonia    Collection Time: 10/07/22  1:21 AM   Result Value Ref Range    Ammonia 236 (H) 11 - 32 UMOL/L   Lactic Acid    Collection Time: 10/07/22  2:40 AM   Result Value Ref Range    Lactic  Acid, Plasma 21.7 (HH) 0.4 - 2.0 MMOL/L   Urinalysis    Collection Time: 10/07/22  3:21 AM   Result Value Ref Range    Color, UA DARK YELLOW      Appearance CLOUDY      Specific Gravity, UA 1.017 1.005 - 1.030      pH, Urine 5.0 5.0 - 8.0      Protein, UA 30 (A) NEG mg/dL    Glucose, UA Negative NEG mg/dL    Ketones, Urine Negative NEG mg/dL    Bilirubin Urine SMALL (A) NEG      Blood, Urine SMALL (A) NEG      Urobilinogen, Urine 1.0 0.2 - 1.0 EU/dL    Nitrite, Urine Negative NEG      Leukocyte Esterase, Urine TRACE (A) NEG     Urine Drug Screen    Collection Time: 10/07/22  3:21 AM   Result Value Ref Range    Benzodiazepines, Urine Negative NEG      Barbiturates, Urine Negative NEG      THC, TH-Cannabinol, Urine Negative NEG      Opiates, Urine Negative NEG      PCP, Urine Negative NEG      Cocaine, Urine Negative NEG      Amphetamine, Urine Negative NEG      Methadone, Urine Negative NEG      Comments: (NOTE)    CBC with Auto Differential    Collection Time: 10/07/22  3:21 AM   Result Value Ref Range    WBC 14.5 (H) 4.6 - 13.2 K/uL    RBC 1.67 (L) 4.20 - 5.30 M/uL    Hemoglobin 6.0 (L) 12.0 - 16.0 g/dL    Hematocrit 16.1 (L) 35.0 - 45.0 %    MCV 109.6 (H)  78.0 - 100.0 FL    MCH 35.9 (H) 24.0 - 34.0 PG    MCHC 32.8 31.0 - 37.0 g/dL    RDW 09.6 (H) 04.5 - 14.5 %    Platelets 52 (L) 135 - 420 K/uL    MPV 11.4 9.2 - 11.8 FL    Nucleated RBCs 1.5 (H) 0 PER 100 WBC    nRBC 0.22 (H) 0.00 - 0.01 K/uL    Neutrophils % 84 (H) 40 - 73 %    Lymphocytes % 5 (L) 21 - 52 %    Monocytes % 10 3 - 10 %    Eosinophils % 0 0 - 5 %    Basophils % 0 0 - 2 %    Immature Granulocytes 1 (H) 0.0 - 0.5 %    Neutrophils Absolute 12.2 (H) 1.8 - 8.0 K/UL    Lymphocytes Absolute 0.7 (L) 0.9 - 3.6 K/UL    Monocytes Absolute 1.5 (H) 0.05 - 1.2 K/UL    Eosinophils Absolute 0.0 0.0 - 0.4 K/UL    Basophils Absolute 0.0 0.0 - 0.1 K/UL    Absolute Immature Granulocyte 0.1 (H) 0.00 - 0.04 K/UL    Differential Type AUTOMATED     Urinalysis, Micro    Collection Time: 10/07/22  3:21 AM   Result Value Ref Range    WBC, UA 0 to 3 0 - 4 /hpf    RBC, UA 0 to 3 0 - 5 /hpf    Epithelial Cells UA 1+ 0 - 5 /lpf    BACTERIA, URINE FEW (A) NEG /hpf   PREPARE RBC (CROSSMATCH), 4 Units    Collection Time: 10/07/22  5:00 AM   Result Value Ref  Range    History Check Historical check performed        Radiologic Studies:   CT CHEST WO CONTRAST   Final Result      Multi lobar lung opacities. Most of these are dependent and may be due to   aspiration.      Few dots of air at the neck adjacent to cervical esophagus not clearly   intraluminal and might be related to the esophageal perforation.      High attenuation fluid in the stomach consistent with clot seen on EGD.      Hematoma right base of neck. See discussion on CTA neck.      Ascites fluid.          CTA ABDOMEN PELVIS W WO CONTRAST   Final Result      No active hemorrhage is seen at the time of the scan.   -High density fluid in the stomach is consistent with clot seen at endoscopy.   -Edema at the antrum consistent with gastritis.      Hyperemic small bowel mucosa and adrenal glands suggesting hypotension and   shock.      Unstable liver disease.   -Cirrhosis and  stigmata of portal hypertension with collateral vessels and   marked ascites fluid.   -Colonic wall thickening at right colon and possibly left colon. Consider   hepatic colopathy.      Poorly enhancing kidneys consistent with renal failure.      Body wall edema.      D/w Dr. Riccardo Dubin.      CT SOFT TISSUE NECK W CONTRAST   Final Result   :      Active bleeding is occurring at right base of neck. It appears to arise from the   right subclavian artery. Ill-defined collection more likely hematoma than   pseudoaneurysm.   -Also hematoma in the superior mediastinum inferior to this active hemorrhage   that may be related to previous or intermittent hemorrhage.   -Recommend consultation with vascular surgery.      Extraluminal air is seen around the cervical and upper thoracic esophagus   compatible with history of perforation seen on endoscopy.      Retropharyngeal fluid is low density but probably related to the bleeding vessel   and less likely to the perforation as it is above the level of the cervical   esophagus and also no gas.      Fluid-filled paranasal sinuses.      D/w Dr. Riccardo Dubin.         Thank you for this referral.      XR CHEST PORTABLE   Final Result   1.  Right internal jugular nontunneled dialysis catheter terminates at the   cavoatrial junction. No pneumothorax is   2.  Interval advancement of endotracheal tube, now terminating approximately 1.5   cm above the carina.   3.  Unchanged bilateral perihilar opacities, possibly relating to pulmonary   edema or pneumonia.      XR CHEST PORTABLE   Final Result      Support tubes in satisfactory position.   No pneumothorax.   Bilateral lower lobe infiltrates      XR CHEST PORTABLE   Final Result      No pneumothorax   Right IJ approach central catheter in satisfactory position..      XR CHEST PORTABLE    (Results Pending)       EKG interpretation: (Preliminary)  EKG read by  Dr. Geraldo Pitter at 2230 sinus rhythm with PVCs, prolonged QT, diffuse ST and T wave  abnormalities    Procedures     Central Line    Date/Time: 10/07/2022 4:59 AM    Performed by: Vista Lawman, MD  Authorized by: Vista Lawman, MD    Consent:     Consent obtained:  Emergent situation  Pre-procedure details:     Indication(s): central venous access, hemodynamic monitoring and insufficient peripheral access      Hand hygiene: Hand hygiene performed prior to insertion      Sterile barrier technique: All elements of maximal sterile technique followed      Skin preparation:  Chlorhexidine    Skin preparation agent: Skin preparation agent completely dried prior to procedure    Sedation:     Sedation type:  None  Anesthesia:     Anesthesia method:  Local infiltration    Local anesthetic:  Lidocaine 1% w/o epi  Procedure details:     Location:  R internal jugular    Patient position:  Trendelenburg    Procedural supplies:  Triple lumen    Catheter size:  7 Fr    Landmarks identified: yes      Ultrasound guidance: yes      Ultrasound guidance timing: real time      Sterile ultrasound techniques: Sterile gel and sterile probe covers were used      Number of attempts:  1    Successful placement: yes    Post-procedure details:     Post-procedure:  Dressing applied and line sutured    Assessment:  Blood return through all ports    Procedure completion:  Tolerated well, no immediate complications      ED Course     12:36 AM EDT I Geraldo Pitter, MD) am the first provider for this patient. Initial assessment performed. I reviewed the vital signs, available nursing notes, past medical history, past surgical history, family history and social history. The patients presenting problems have been discussed, and they are in agreement with the care plan formulated and outlined with them.  I have encouraged them to ask questions as they arise throughout their visit.    Records Reviewed: Nursing Notes and Old Medical Records    Is this patient to be included in the SEP-1 core measure? Yes SEP-1 CORE MEASURE  DATA      Sepsis Criteria   Severe Sepsis Criteria   Septic Shock Criteria       Must meet 2:    [] Temp >100.9 F (38.3 C) or < 96.8 F (36 C)  [] HR > 90  [] RR > 20  [] WBC > 12 or < 4 or 10% bands    AND:    []  Infection Confirmed or Suspected.     Must meet 1:    [] Lactate > 2       or   [] Signs of Organ Dysfunction:    - SBP < 90 or MAP < 65  -Creatinine > 2 or increased from baseline  -Urine Output < 0.5 ml/kg/hr  -Bilirubin > 2  -INR > 1.5 (not anticoagulated)  -Platelets < 100,000  -Acute Respiratory Failure as evidenced by new need for NIPPV or mechanical ventilation   Must meet 1:    [x] Lactate > 4        or   [x] SBP < 90 or MAP < 65 for at least two readings in the first hour after fluid bolus administration    [x] Vasopressors  initiated (if hypotension persists after fluid resuscitation)   Patient Vitals for the past 6 hrs:   BP Temp Pulse Resp SpO2   10/28/22 2230 (!) 95/44 -- 78 15 (!) 80 %   2022-10-28 2244 -- (!) 93.3 F (34.1 C) -- -- --   10-28-22 2245 (!) 93/55 -- 87 17 --   Oct 28, 2022 2300 102/61 -- 88 12 --   10-28-2022 2315 -- -- 89 17 --   10-28-2022 2330 (!) 79/65 -- 97 16 --   10/28/22 2345 95/71 -- (!) 103 19 96 %   10/07/22 0000 (!) 121/43 -- 98 15 (!) 69 %   10/07/22 0015 (!) 131/47 -- 96 19 (!) 73 %   10/07/22 0030 (!) 113/59 -- (!) 111 21 (!) 84 %   10/07/22 0045 (!) 124/96 -- (!) 111 17 (!) 86 %   10/07/22 0100 (!) 104/55 -- 100 16 --   10/07/22 0115 (!) 84/46 -- (!) 102 13 --   10/07/22 0130 (!) 87/55 -- (!) 109 14 --   10/07/22 0145 (!) 90/57 (!) 95 F (35 C) (!) 101 16 100 %   10/07/22 0230 (!) 96/46 (!) 95.5 F (35.3 C) (!) 110 18 100 %   10/07/22 0245 (!) 103/50 -- (!) 116 17 100 %   10/07/22 0300 (!) 111/56 -- 100 16 100 %   10/07/22 0315 (!) 102/53 -- (!) 106 17 100 %   10/07/22 0330 (!) 114/50 -- (!) 119 18 100 %   10/07/22 0345 (!) 99/46 -- (!) 106 18 100 %   10/07/22 0400 (!) 107/45 -- (!) 103 13 100 %      Recent Labs     10-28-2022  2219 10/07/22  0321   WBC 16.9* 14.5*   CREATININE  2.83*  --    BILITOT 3.6*  --    INR 3.0*  --    PLT 63* 52*        Septic shock identified date: 10/07/2022 time: 2230    Fluid Resuscitation Rationale: at least 45mL/kg based on entered actual weight at time of triage    Repeat lactate level: improving    Reassessment Exam: I have reassessed tissue perfusion and hemodynamic status after fluid bolus at this date/time: 10/07/2022 0427    MEDICATIONS ADMINISTERED IN THE ED:  Medications   cefTRIAXone (ROCEPHIN) 1,000 mg in sterile water 10 mL IV syringe (1,000 mg IntraVENous Given 10/07/22 0105)   pantoprazole (PROTONIX) 40 mg in sodium chloride 0.9 % 50 mL (mini-bag) infusion (8 mg/hr IntraVENous New Bag 10/07/22 0117)   vancomycin (VANCOCIN) intermittent dosing (placeholder) (has no administration in time range)   norepinephrine (LEVOPHED) 16 mg in sodium chloride 0.9 % 250 mL infusion (9 mcg/min IntraVENous Rate/Dose Change 10/07/22 0357)   octreotide (SANDOSTATIN) 500 mcg in sodium chloride 0.9 % 100 mL infusion (has no administration in time range)   0.9 % sodium chloride infusion (has no administration in time range)   sodium chloride 0.9 % bolus 1,000 mL (0 mLs IntraVENous Stopped 10/07/22 0059)   hydrocortisone sodium succinate PF (SOLU-CORTEF) injection 100 mg (100 mg IntraVENous Given 10/07/22 0059)   dextrose bolus 10% 250 mL (0 mLs IntraVENous Stopped 10/07/22 0000)   sodium chloride 0.9 % bolus 500 mL (0 mLs IntraVENous Stopped 10/07/22 0508)   sodium chloride 0.9 % bolus 500 mL (0 mLs IntraVENous Stopped 10/07/22 0109)   pantoprazole (PROTONIX) 80 mg in sodium chloride (PF) 0.9 % 20 mL injection (80 mg  IntraVENous Given 10/07/22 0104)   vancomycin (VANCOCIN) 1,500 mg in sodium chloride 0.9 % 500 mL IVPB (0 mg IntraVENous Stopped 10/07/22 0338)   glucagon (rDNA) 1 MG injection (1 mg  Given October 13, 2022 2306)   phytonadione (ADULT) (VITAMIN K) 10 mg in sodium chloride 0.9 % 100 mL IVPB (0 mg IntraVENous Stopped 10/07/22 0229)            Medical Decision Making     CC/HPI  Summary, DDx, ED Course, and Reassessment: The patient is a 72 year old woman with cirrhosis of the liver with ascites and esophageal varices who presented to the ED today with nausea and vomiting of bright red blood as well as coffee-ground emesis.  She also saw blood in her stool and had several episodes of diarrhea.  Her initial core body temperature was 93.3, her initial blood pressure was 78/55.  Her initial oxygen saturation was 89% on room air.  I ordered IV vancomycin, IV Rocephin, IV Protonix, IV fluids which add up to just a little bit above 30 mL/kg's of IV fluid for presumed sepsis.    The patient's blood sugar was also less than 20.  I ordered a D10 bolus.  I also ordered IV hydrocortisone in the event that she may have adrenal crisis.  The patient's blood sugar has improved to 151.    We have had a very difficult time obtaining IV access.  I placed a central line.  Please refer to procedure note below.    I suspect that the patient most likely has SBP and an upper GI bleed.  Once I was able to locate in the patient's chart that she has esophageal varices, I ordered IV octreotide.    The patient's hemoglobin has dropped from 8.3-6.0 while in the ED.  We consulted the family about a blood transfusion knowing that the patient is a [employer].  The patient is most likely encephalopathic secondary to her elevated ammonia level.  I spoke with the patient's niece.  She called the patient's son.  The patient's son consents to a transfusion.    At this time, the patient's CT of her abdomen and pelvis is pending.  I have consulted the ICU.  They have accepted the patient on their service.  I also spoke with GI and I have added Dr. Bjorn Pippin to the treatment team.    I did advise the family of how sick the patient is.  They are going to have a family meeting and the patient's niece will come up to the ED.  I did advise them that she could potentially pass away and that her numbers are going in the wrong  direction.      Disposition Considerations (tests considered but not done, Admit vs D/C, Shared Decision Making, Pt Expectation of Test or Tx.): 0       Critical Care Time:     Critical Care Procedure Note  Authorized and Performed by: Geraldo Pitter, MD  Total critical care time: Approximately 150 minutes  Due to a high probability of clinically significant, life threatening deterioration, the patient required my highest level of preparedness to intervene emergently and I personally spent this critical care time directly and personally managing the patient. This critical care time included obtaining a history; examining the patient; pulse oximetry; ordering and review of studies; arranging urgent treatment with development of a management plan; evaluation of patient's response to treatment; frequent reassessment; and, discussions with other providers.  This critical care time was  performed to assess and manage the high probability of imminent, life-threatening deterioration that could result in multi-organ failure. It was exclusive of separately billable procedures and treating other patients and teaching time.  Please see MDM section and the rest of the note for further information on patient assessment and treatment.      Vista Lawman, MD    Diagnosis and Disposition     I Geraldo Pitter, MD am the primary clinician of record.        DIAGNOSIS:   1. Upper GI bleed    2. SBP (spontaneous bacterial peritonitis) (HCC)    3. Hepatic encephalopathy (HCC)    4. Cirrhosis of liver with ascites, unspecified hepatic cirrhosis type (HCC)    5. Septic shock (HCC)    6. Hypoglycemia    7. Hypothermia, initial encounter        DISPOSITION Decision To Admit 10/07/2022 05:34:08 AM      PATIENT REFERRED TO:  No follow-up provider specified.    DISCHARGE MEDICATIONS:  New Prescriptions    No medications on file       DISCONTINUED MEDICATIONS:  Discontinued Medications    No medications on file              (Please note that portions  of this note were completed with a voice recognition program.  Efforts were made to edit the dictations but occasionally words are mis-transcribed.)    Geraldo Pitter, MD (electronically signed)        Dragon Disclaimer     Please note that this dictation was completed with Dragon, the computer voice recognition software.  Quite often unanticipated grammatical, syntax, homophones, and other interpretive errors are inadvertently transcribed by the computer software.  Please disregard these errors.  Please excuse any errors that have escaped final proofreading.

## 2022-10-06 NOTE — ED Triage Notes (Signed)
EMS arrival from home with c/o bright red blood stools and vomiting "coffee grounds" x2 days. Hx esophageal stricture per EMS.

## 2022-10-06 NOTE — Progress Notes (Signed)
[facility] Pharmacokinetic Monitoring Service - Vancomycin     BESSY SNODDY is a 72 y.o. female starting on vancomycin therapy for Sepsis of Unknown Etiology. Pharmacy consulted for monitoring and adjustment.    Target Concentration: Dosing based on anticipated concentration <15 mg/L due to renal impairment/insufficiency    Additional Antimicrobials: Ceftriaxone    Pertinent Laboratory Values:   Temp: (!) 93.3 F (34.1 C), Weight - Scale: 60.7 kg (133 lb 12.8 oz)  Recent Labs     10/20/2022  2219   CREATININE 2.83*   BUN 41*   WBC 16.9*     Estimated Creatinine Clearance: 15 mL/min (A) (based on SCr of 2.83 mg/dL (H)).    Pertinent Cultures:  Culture Date Source Results   None - -   MRSA Nasal Swab: N/A. Non-respiratory infection    Plan:  Concentration-guided dosing due to renal impairment/insufficiency  Start vancomycin 1500 mg IV x 1  Will dose according to vancomycin concentration levels at this time due to renal insufficiency  Renal labs as indicated   Vancomycin concentration not ordered at this time  Pharmacy will continue to monitor patient and adjust therapy as indicated    Thank you for the consult,  Lonia Farber, [facility]  10/13/2022

## 2022-10-07 ENCOUNTER — Inpatient Hospital Stay: Admit: 2022-10-07 | Payer: MEDICARE | Primary: Family

## 2022-10-07 ENCOUNTER — Inpatient Hospital Stay
Admission: EM | Admit: 2022-10-07 | Discharge: 2022-10-28 | Disposition: E | Payer: MEDICARE | Admitting: Critical Care Medicine

## 2022-10-07 ENCOUNTER — Emergency Department: Payer: MEDICARE | Primary: Family

## 2022-10-07 ENCOUNTER — Emergency Department: Admit: 2022-10-07 | Payer: MEDICARE | Primary: Family

## 2022-10-07 ENCOUNTER — Inpatient Hospital Stay: Payer: MEDICARE | Primary: Family

## 2022-10-07 DIAGNOSIS — K922 Gastrointestinal hemorrhage, unspecified: Secondary | ICD-10-CM

## 2022-10-07 DIAGNOSIS — A419 Sepsis, unspecified organism: Secondary | ICD-10-CM

## 2022-10-07 LAB — CELL COUNT WITH DIFFERENTIAL, BODY FLUID
Bands, Fluid: 0 %
Eos, Fluid: 0 % — AB
Lymphocytes, Body Fluid: 29 % — AB
Monocyte Count, Fluid: 61 % — AB
Polys, Fluid: 10 % (ref 0–25)
RBC, Fluid: <2000 /mm3 — AB
Total Nucleated Cell Count: 35 /mm3 — AB

## 2022-10-07 LAB — BASIC METABOLIC PANEL
Anion Gap: 21 mmol/L — ABNORMAL HIGH (ref 3.0–18)
Anion Gap: 16 mmol/L (ref 3.0–18)
BUN/Creatinine Ratio: 15 (ref 12–20)
BUN/Creatinine Ratio: 16 (ref 12–20)
BUN: 43 MG/DL — ABNORMAL HIGH (ref 7.0–18)
BUN: 45 MG/DL — ABNORMAL HIGH (ref 7.0–18)
CO2: 17 mmol/L — ABNORMAL LOW (ref 21–32)
CO2: 19 mmol/L — ABNORMAL LOW (ref 21–32)
Calcium: 7.7 MG/DL — ABNORMAL LOW (ref 8.5–10.1)
Calcium: 6.2 MG/DL — ABNORMAL LOW (ref 8.5–10.1)
Chloride: 104 mmol/L (ref 100–111)
Chloride: 101 mmol/L (ref 100–111)
Creatinine: 2.79 MG/DL — ABNORMAL HIGH (ref 0.6–1.3)
Creatinine: 2.83 MG/DL — ABNORMAL HIGH (ref 0.6–1.3)
Est, Glom Filt Rate: 17 mL/min/{1.73_m2} — ABNORMAL LOW (ref >60)
Est, Glom Filt Rate: 17 mL/min/{1.73_m2} — ABNORMAL LOW (ref >60)
Glucose: 62 mg/dL — ABNORMAL LOW (ref 74–99)
Glucose: 369 mg/dL — ABNORMAL HIGH (ref 74–99)
Potassium: 4.9 mmol/L (ref 3.5–5.5)
Potassium: 4.8 mmol/L (ref 3.5–5.5)
Sodium: 142 mmol/L (ref 136–145)
Sodium: 136 mmol/L (ref 136–145)

## 2022-10-07 LAB — CBC WITH AUTO DIFFERENTIAL
Basophils %: 0 % (ref 0–2)
Basophils %: 0 % (ref 0–2)
Basophils %: 0 % (ref 0–2)
Basophils Absolute: 0 10*3/uL (ref 0.0–0.1)
Basophils Absolute: 0 10*3/uL (ref 0.0–0.1)
Basophils Absolute: 0 10*3/uL (ref 0.0–0.1)
Eosinophils %: 0 % (ref 0–5)
Eosinophils %: 0 % (ref 0–5)
Eosinophils %: 0 % (ref 0–5)
Eosinophils Absolute: 0 10*3/uL (ref 0.0–0.4)
Eosinophils Absolute: 0 10*3/uL (ref 0.0–0.4)
Eosinophils Absolute: 0 10*3/uL (ref 0.0–0.4)
Hematocrit: 26.5 % — ABNORMAL LOW (ref 35.0–45.0)
Hematocrit: 18.3 % — ABNORMAL LOW (ref 35.0–45.0)
Hematocrit: 21.4 % — ABNORMAL LOW (ref 35.0–45.0)
Hemoglobin: 8.7 g/dL — ABNORMAL LOW (ref 12.0–16.0)
Hemoglobin: 6 g/dL — ABNORMAL LOW (ref 12.0–16.0)
Hemoglobin: 7.2 g/dL — ABNORMAL LOW (ref 12.0–16.0)
Immature Granulocytes %: 1 % — ABNORMAL HIGH (ref 0.0–0.5)
Immature Granulocytes %: 1 % — ABNORMAL HIGH (ref 0.0–0.5)
Immature Granulocytes %: 1 % — ABNORMAL HIGH (ref 0.0–0.5)
Immature Granulocytes Absolute: 0.2 10*3/uL — ABNORMAL HIGH (ref 0.00–0.04)
Immature Granulocytes Absolute: 0.1 10*3/uL — ABNORMAL HIGH (ref 0.00–0.04)
Immature Granulocytes Absolute: 0.1 10*3/uL — ABNORMAL HIGH (ref 0.00–0.04)
Lymphocytes %: 4 % — ABNORMAL LOW (ref 21–52)
Lymphocytes %: 5 % — ABNORMAL LOW (ref 21–52)
Lymphocytes %: 5 % — ABNORMAL LOW (ref 21–52)
Lymphocytes Absolute: 0.7 10*3/uL — ABNORMAL LOW (ref 0.9–3.6)
Lymphocytes Absolute: 0.7 10*3/uL — ABNORMAL LOW (ref 0.9–3.6)
Lymphocytes Absolute: 0.7 10*3/uL — ABNORMAL LOW (ref 0.9–3.6)
MCH: 36.1 PG — ABNORMAL HIGH (ref 24.0–34.0)
MCH: 35.9 PG — ABNORMAL HIGH (ref 24.0–34.0)
MCH: 36 PG — ABNORMAL HIGH (ref 24.0–34.0)
MCHC: 32.8 g/dL (ref 31.0–37.0)
MCHC: 32.8 g/dL (ref 31.0–37.0)
MCHC: 33.6 g/dL (ref 31.0–37.0)
MCV: 110 FL — ABNORMAL HIGH (ref 78.0–100.0)
MCV: 109.6 FL — ABNORMAL HIGH (ref 78.0–100.0)
MCV: 107 FL — ABNORMAL HIGH (ref 78.0–100.0)
MPV: 11.9 FL — ABNORMAL HIGH (ref 9.2–11.8)
MPV: 11.4 FL (ref 9.2–11.8)
MPV: 12.1 FL — ABNORMAL HIGH (ref 9.2–11.8)
Monocytes %: 7 % (ref 3–10)
Monocytes %: 10 % (ref 3–10)
Monocytes %: 8 % (ref 3–10)
Monocytes Absolute: 1.1 10*3/uL (ref 0.05–1.2)
Monocytes Absolute: 1.5 10*3/uL — ABNORMAL HIGH (ref 0.05–1.2)
Monocytes Absolute: 1.1 10*3/uL (ref 0.05–1.2)
Neutrophils %: 88 % — ABNORMAL HIGH (ref 40–73)
Neutrophils %: 84 % — ABNORMAL HIGH (ref 40–73)
Neutrophils %: 86 % — ABNORMAL HIGH (ref 40–73)
Neutrophils Absolute: 14.9 10*3/uL — ABNORMAL HIGH (ref 1.8–8.0)
Neutrophils Absolute: 12.2 10*3/uL — ABNORMAL HIGH (ref 1.8–8.0)
Neutrophils Absolute: 11.7 10*3/uL — ABNORMAL HIGH (ref 1.8–8.0)
Nucleated RBCs: 2 PER 100 WBC — ABNORMAL HIGH
Nucleated RBCs: 1.5 PER 100 WBC — ABNORMAL HIGH
Nucleated RBCs: 1.9 PER 100 WBC — ABNORMAL HIGH
Platelets: 63 10*3/uL — ABNORMAL LOW (ref 135–420)
Platelets: 52 10*3/uL — ABNORMAL LOW (ref 135–420)
Platelets: 72 10*3/uL — ABNORMAL LOW (ref 135–420)
RBC: 2.41 M/uL — ABNORMAL LOW (ref 4.20–5.30)
RBC: 1.67 M/uL — ABNORMAL LOW (ref 4.20–5.30)
RBC: 2 M/uL — ABNORMAL LOW (ref 4.20–5.30)
RDW: 15.9 % — ABNORMAL HIGH (ref 11.6–14.5)
RDW: 16.1 % — ABNORMAL HIGH (ref 11.6–14.5)
RDW: 16.2 % — ABNORMAL HIGH (ref 11.6–14.5)
WBC: 16.9 10*3/uL — ABNORMAL HIGH (ref 4.6–13.2)
WBC: 14.5 10*3/uL — ABNORMAL HIGH (ref 4.6–13.2)
WBC: 13.6 10*3/uL — ABNORMAL HIGH (ref 4.6–13.2)
nRBC: 0.34 10*3/uL — ABNORMAL HIGH (ref 0.00–0.01)
nRBC: 0.22 10*3/uL — ABNORMAL HIGH (ref 0.00–0.01)
nRBC: 0.26 10*3/uL — ABNORMAL HIGH (ref 0.00–0.01)

## 2022-10-07 LAB — COMPREHENSIVE METABOLIC PANEL
ALT: 53 U/L (ref 13–56)
AST: 117 U/L — ABNORMAL HIGH (ref 10–38)
Albumin/Globulin Ratio: 0.5 — ABNORMAL LOW (ref 0.8–1.7)
Albumin: 1.9 g/dL — ABNORMAL LOW (ref 3.4–5.0)
Alk Phosphatase: 108 U/L (ref 45–117)
Anion Gap: 26 mmol/L — ABNORMAL HIGH (ref 3.0–18)
BUN/Creatinine Ratio: 14 (ref 12–20)
BUN: 41 MG/DL — ABNORMAL HIGH (ref 7.0–18)
CO2: 18 mmol/L — ABNORMAL LOW (ref 21–32)
Calcium: 8.8 MG/DL (ref 8.5–10.1)
Chloride: 100 mmol/L (ref 100–111)
Creatinine: 2.83 MG/DL — ABNORMAL HIGH (ref 0.6–1.3)
Est, Glom Filt Rate: 17 mL/min/{1.73_m2} — ABNORMAL LOW (ref >60)
Globulin: 3.9 g/dL (ref 2.0–4.0)
Glucose: 39 mg/dL — CL (ref 74–99)
Potassium: 4 mmol/L (ref 3.5–5.5)
Sodium: 144 mmol/L (ref 136–145)
Total Bilirubin: 3.6 MG/DL — ABNORMAL HIGH (ref 0.2–1.0)
Total Protein: 5.8 g/dL — ABNORMAL LOW (ref 6.4–8.2)

## 2022-10-07 LAB — APTT: APTT: 40.8 s — ABNORMAL HIGH (ref 23.0–36.4)

## 2022-10-07 LAB — T4, FREE: T4 Free: 1.3 NG/DL (ref 0.7–1.5)

## 2022-10-07 LAB — POCT GLUCOSE
POC Glucose: 43 mg/dL — CL (ref 70–110)
POC Glucose: <20 mg/dL — CL (ref 70–110)
POC Glucose: 20 mg/dL — CL (ref 70–110)
POC Glucose: 139 mg/dL — ABNORMAL HIGH (ref 70–110)
POC Glucose: 151 mg/dL — ABNORMAL HIGH (ref 70–110)
POC Glucose: 46 mg/dL — CL (ref 70–110)
POC Glucose: 55 mg/dL — ABNORMAL LOW (ref 70–110)
POC Glucose: 279 mg/dL — ABNORMAL HIGH (ref 70–110)

## 2022-10-07 LAB — EKG 12-LEAD
Atrial Rate: 80 {beats}/min
P Axis: 63 degrees
P-R Interval: 114 ms
Q-T Interval: 450 ms
QRS Duration: 72 ms
QTc Calculation (Bazett): 519 ms
R Axis: 60 degrees
T Axis: 36 degrees
Ventricular Rate: 80 {beats}/min

## 2022-10-07 LAB — URINALYSIS, MICRO
RBC, UA: 0 /hpf (ref 0–5)
WBC, UA: 0 /hpf (ref 0–4)

## 2022-10-07 LAB — POC CG4:BLOOD GAS,LACTATE
Base Deficit (POC): 16.7 mmol/L
FIO2: 100 %
POC HCO3: 11.2 MMOL/L — ABNORMAL LOW (ref 22–26)
POC Lactic Acid: 16.51 mmol/L (ref 0.40–2.00)
POC O2 SAT: 99.7 % — ABNORMAL HIGH (ref 92–97)
POC PO2: 264 MMHG — ABNORMAL HIGH (ref 80–100)
POC TIDAL VOLUME: 350 ml
POC pCO2: 37.1 MMHG (ref 35.0–45.0)
POC pH: 7.09 — CL (ref 7.35–7.45)
Pt Temp: 98

## 2022-10-07 LAB — POCT BLOOD GAS & ELECTROLYTES
Allen Test: POSITIVE
Base Deficit (POC): 12.6 mmol/L
FIO2 Arterial: 100 %
PCO2, Venus, POC: 46.2 MMHG (ref 41–51)
PH, VENOUS (POC): 7.13 — CL (ref 7.32–7.42)
PO2, VENOUS (POC): 37 mmHg (ref 25–40)
POC Chloride: 99 mmol/L (ref 98–107)
POC Creatinine: 2.55 mg/dL — ABNORMAL HIGH (ref 0.6–1.3)
POC Glucose: 348 mg/dL — ABNORMAL HIGH (ref 65–100)
POC HCO3: 15.5 MMOL/L — ABNORMAL LOW (ref 22–26)
POC Ionized Calcium: 0.95 mmol/L — ABNORMAL LOW (ref 1.12–1.32)
POC Lactic Acid: >18 mmol/L (ref 0.40–2.00)
POC O2 SAT: 53 %
POC PEEP/CPA: 6
POC Potassium: 4.9 mmol/L (ref 3.5–5.1)
POC Sodium: 136 mmol/L (ref 136–145)
POC TCO2: 16 MMOL/L — ABNORMAL LOW (ref 19–24)
POC TIDAL VOLUME: 350
Respiratory Rate: 16
eGFR, POC: 19 mL/min/{1.73_m2} — ABNORMAL LOW (ref >60)

## 2022-10-07 LAB — URINE DRUG SCREEN
Amphetamine, Urine: NEGATIVE
Barbiturates, Urine: NEGATIVE
Benzodiazepines, Urine: NEGATIVE
Cocaine, Urine: NEGATIVE
Methadone, Urine: NEGATIVE
Opiates, Urine: NEGATIVE
Phencyclidine, Urine: NEGATIVE
THC, TH-Cannabinol, Urine: NEGATIVE

## 2022-10-07 LAB — URINALYSIS
Glucose, UA: NEGATIVE mg/dL
Ketones, Urine: NEGATIVE mg/dL
Nitrite, Urine: NEGATIVE
Protein, UA: 30 mg/dL — AB
Specific Gravity, UA: 1.017 (ref 1.005–1.030)
Urobilinogen, Urine: 1 EU/dL (ref 0.2–1.0)
pH, Urine: 5 (ref 5.0–8.0)

## 2022-10-07 LAB — LACTIC ACID
Lactic Acid, Plasma: 19.8 MMOL/L (ref 0.4–2.0)
Lactic Acid, Plasma: 21.7 MMOL/L (ref 0.4–2.0)
Lactic Acid, Plasma: 16.8 MMOL/L (ref 0.4–2.0)
Lactic Acid, Plasma: 15.1 MMOL/L (ref 0.4–2.0)
Lactic Acid, Plasma: 13.4 MMOL/L (ref 0.4–2.0)

## 2022-10-07 LAB — HEPATITIS B SURFACE ANTIGEN
Hep B S Ag Interp: NEGATIVE
Hepatitis B Surface Ag: <0.1 Index (ref <1.00)

## 2022-10-07 LAB — PROTEIN, BODY FLUID: Total Protein, Body Fluid: <2 g/dL

## 2022-10-07 LAB — PREPARE RBC (CROSSMATCH)

## 2022-10-07 LAB — AMMONIA
Ammonia: 236 umol/L — ABNORMAL HIGH (ref 11–32)
Ammonia: 99 umol/L — ABNORMAL HIGH (ref 11–32)

## 2022-10-07 LAB — ALBUMIN, FLUID: Albumin, Fluid: <0.6 g/dL

## 2022-10-07 LAB — CYTOLOGY, NON-GYN

## 2022-10-07 LAB — LIPASE: Lipase: 35 U/L (ref 13–75)

## 2022-10-07 LAB — TSH: TSH, 3rd Generation: 3.8 u[IU]/mL — ABNORMAL HIGH (ref 0.36–3.74)

## 2022-10-07 LAB — PROTIME-INR
INR: 3 — ABNORMAL HIGH (ref 0.9–1.1)
INR: >9.2 (ref 0.9–1.1)
Protime: 31.7 s — ABNORMAL HIGH (ref 11.9–14.7)
Protime: >75 s — ABNORMAL HIGH (ref 11.9–14.7)

## 2022-10-07 LAB — PHOSPHORUS
Phosphorus: 6.4 MG/DL — ABNORMAL HIGH (ref 2.5–4.9)
Phosphorus: 6.2 MG/DL — ABNORMAL HIGH (ref 2.5–4.9)

## 2022-10-07 LAB — FIBRINOGEN: Fibrinogen: 60 mg/dL — CL (ref 210–451)

## 2022-10-07 LAB — ETHANOL: Ethanol Lvl: <3 MG/DL (ref 0–3)

## 2022-10-07 LAB — MAGNESIUM
Magnesium: 1.8 mg/dL (ref 1.6–2.6)
Magnesium: 1.7 mg/dL (ref 1.6–2.6)

## 2022-10-07 LAB — PROCALCITONIN: Procalcitonin: 5.14 ng/mL

## 2022-10-07 MED ORDER — SODIUM CHLORIDE 0.9 % IV SOLN
0.9 % | Freq: Once | INTRAVENOUS | Status: AC
Start: 2022-10-07 — End: 2022-10-07
  Administered 2022-10-07: 05:00:00 1500 mg/kg via INTRAVENOUS

## 2022-10-07 MED ORDER — STERILE WATER FOR INJECTION (MIXTURES ONLY)
1 g | INTRAMUSCULAR | Status: DC
Start: 2022-10-07 — End: 2022-10-08
  Administered 2022-10-07: 05:00:00 1000 mg via INTRAVENOUS

## 2022-10-07 MED ORDER — NOREPINEPHRINE-SODIUM CHLORIDE 16-0.9 MG/250ML-% IV SOLN
INTRAVENOUS | Status: DC
Start: 2022-10-07 — End: 2022-10-08
  Administered 2022-10-07: 06:00:00 5 ug/min via INTRAVENOUS

## 2022-10-07 MED ORDER — PROPOFOL 1000 MG/100ML IV EMUL
1000 | INTRAVENOUS | Status: DC
Start: 2022-10-07 — End: 2022-10-08
  Administered 2022-10-07: 16:00:00 20 ug/kg/min via INTRAVENOUS

## 2022-10-07 MED ORDER — FENTANYL CITRATE (PF) 100 MCG/2ML IJ SOLN
1002 MCG/2ML | INTRAMUSCULAR | Status: DC | PRN
Start: 2022-10-07 — End: 2022-10-08

## 2022-10-07 MED ORDER — PHENYLEPHRINE HCL-NACL 50-0.9 MG/250ML-% IV SOLN
50-0.9250- MG/250ML-% | INTRAVENOUS | Status: DC
Start: 2022-10-07 — End: 2022-10-08

## 2022-10-07 MED ORDER — RIFAXIMIN 200 MG PO TABS
200 | Freq: Three times a day (TID) | ORAL | Status: DC
Start: 2022-10-07 — End: 2022-10-07

## 2022-10-07 MED ORDER — NOREPINEPHRINE-SODIUM CHLORIDE 16-0.9 MG/250ML-% IV SOLN
INTRAVENOUS | Status: DC
Start: 2022-10-07 — End: 2022-10-08
  Administered 2022-10-07: 23:00:00 75 ug/min via INTRAVENOUS
  Administered 2022-10-08: 01:00:00 100 ug/min via INTRAVENOUS

## 2022-10-07 MED ORDER — SODIUM CHLORIDE (PF) 0.9 % IJ SOLN
0.9 % | Freq: Once | INTRAMUSCULAR | Status: AC
Start: 2022-10-07 — End: 2022-10-07
  Administered 2022-10-07: 05:00:00 80 mg via INTRAVENOUS

## 2022-10-07 MED ORDER — PHENYLEPHRINE HCL-NACL 50-0.9 MG/250ML-% IV SOLN
INTRAVENOUS | Status: DC
Start: 2022-10-07 — End: 2022-10-08

## 2022-10-07 MED ORDER — METOCLOPRAMIDE HCL 5 MG/ML IJ SOLN
5 | Freq: Once | INTRAMUSCULAR | Status: DC
Start: 2022-10-07 — End: 2022-10-08

## 2022-10-07 MED ORDER — SODIUM CHLORIDE 0.9 % IV SOLN
0.9 % | INTRAVENOUS | Status: DC
Start: 2022-10-07 — End: 2022-10-08
  Administered 2022-10-07: 17:00:00 25 ug/h via INTRAVENOUS

## 2022-10-07 MED ORDER — ALBUMIN HUMAN 5 % IV SOLN
5 | Freq: Once | INTRAVENOUS | Status: AC
Start: 2022-10-07 — End: 2022-10-07
  Administered 2022-10-07: 15:00:00 25 g via INTRAVENOUS

## 2022-10-07 MED ORDER — CALCIUM GLUCONATE 10 % IV SOLN
10 % | INTRAVENOUS | Status: DC | PRN
Start: 2022-10-07 — End: 2022-10-08

## 2022-10-07 MED ORDER — SODIUM CHLORIDE 0.9 % IV SOLN
0.9 % | INTRAVENOUS | Status: DC
Start: 2022-10-07 — End: 2022-10-07

## 2022-10-07 MED ORDER — SODIUM BICARBONATE 8.4 % IV SOLN
8.4 | INTRAVENOUS | Status: DC
Start: 2022-10-07 — End: 2022-10-08
  Administered 2022-10-07: 23:00:00 via INTRAVENOUS

## 2022-10-07 MED ORDER — MAGNESIUM SULFATE IN D5W 1-5 GM/100ML-% IV SOLN
1-5100- GM/100ML-% | INTRAVENOUS | Status: DC | PRN
Start: 2022-10-07 — End: 2022-10-08

## 2022-10-07 MED ORDER — SODIUM BICARBONATE 8.4 % IV SOLN
8.4 | INTRAVENOUS | Status: DC
Start: 2022-10-07 — End: 2022-10-07

## 2022-10-07 MED ORDER — SODIUM PHOSPHATE 3 MMOL/ML IV SOLN (MIXTURES ONLY)
3 mmol/mL | INTRAVENOUS | Status: DC | PRN
Start: 2022-10-07 — End: 2022-10-08

## 2022-10-07 MED ORDER — CHLORHEXIDINE GLUCONATE 0.12 % MT SOLN
0.12 % | Freq: Two times a day (BID) | OROMUCOSAL | Status: DC
Start: 2022-10-07 — End: 2022-10-08
  Administered 2022-10-07: 18:00:00 15 mL via OROMUCOSAL

## 2022-10-07 MED ORDER — SODIUM CHLORIDE 0.9 % IV SOLN
0.9 % | INTRAVENOUS | Status: DC
Start: 2022-10-07 — End: 2022-10-08
  Administered 2022-10-07: 18:00:00 0.03 [IU]/min via INTRAVENOUS

## 2022-10-07 MED ORDER — SODIUM CHLORIDE 0.9 % IV BOLUS
0.9 | Freq: Once | INTRAVENOUS | Status: AC
Start: 2022-10-07 — End: 2022-10-07
  Administered 2022-10-07: 03:00:00 1000 mL via INTRAVENOUS

## 2022-10-07 MED ORDER — SODIUM CHLORIDE 0.9 % IV SOLN
0.9 % | INTRAVENOUS | Status: DC | PRN
Start: 2022-10-07 — End: 2022-10-08

## 2022-10-07 MED ORDER — DEXTROSE 10 % IV BOLUS
Freq: Once | INTRAVENOUS | Status: AC
Start: 2022-10-07 — End: 2022-10-07
  Administered 2022-10-07: 03:00:00 250 mL via INTRAVENOUS

## 2022-10-07 MED ORDER — ACD FORMULA A 0.73-2.45-2.2 GM/100ML VI SOLN
0.73-2.45-2.2100 GM/100ML | Status: DC
Start: 2022-10-07 — End: 2022-10-08
  Administered 2022-10-07: 21:00:00

## 2022-10-07 MED ORDER — EPINEPHRINE 1 MG/ML IJ SOLN (MIXTURES ONLY)
0.9 % | INTRAVENOUS | Status: DC
Start: 2022-10-07 — End: 2022-10-08
  Administered 2022-10-07: 21:00:00 1 ug/min via INTRAVENOUS

## 2022-10-07 MED ORDER — HYDROCORTISONE SOD SUC (PF) 100 MG IJ SOLR
100 | Freq: Four times a day (QID) | INTRAMUSCULAR | Status: DC
Start: 2022-10-07 — End: 2022-10-08
  Administered 2022-10-07: 22:00:00 50 mg via INTRAVENOUS

## 2022-10-07 MED ORDER — PRISMASOL BGK 2/3.5 DIALYSIS SOLUTION
Status: DC
Start: 2022-10-07 — End: 2022-10-08
  Administered 2022-10-07: 21:00:00 via INTRAVENOUS_CENTRAL

## 2022-10-07 MED ORDER — VANCOMYCIN INTERMITTENT DOSING (PLACEHOLDER)
INTRAVENOUS | Status: DC
Start: 2022-10-07 — End: 2022-10-08

## 2022-10-07 MED ORDER — MIDAZOLAM HCL (PF) 2 MG/2ML IJ SOLN
22 MG/ML | INTRAMUSCULAR | Status: DC | PRN
Start: 2022-10-07 — End: 2022-10-08

## 2022-10-07 MED ORDER — IOPAMIDOL 76 % IV SOLN
76 | Freq: Once | INTRAVENOUS | Status: AC | PRN
Start: 2022-10-07 — End: 2022-10-07
  Administered 2022-10-07: 21:00:00 100 mL via INTRAVENOUS

## 2022-10-07 MED ORDER — SODIUM CHLORIDE 0.9 % IV BOLUS
0.9 | Freq: Once | INTRAVENOUS | Status: AC
Start: 2022-10-07 — End: 2022-10-07
  Administered 2022-10-07: 05:00:00 500 mL via INTRAVENOUS

## 2022-10-07 MED ORDER — SODIUM CHLORIDE 0.9 % IV SOLN
0.9 | INTRAVENOUS | Status: DC | PRN
Start: 2022-10-07 — End: 2022-10-07

## 2022-10-07 MED ORDER — RIFAXIMIN 550 MG PO TABS
550 | Freq: Two times a day (BID) | ORAL | Status: DC
Start: 2022-10-07 — End: 2022-10-08
  Administered 2022-10-07: 18:00:00 550 mg via ORAL

## 2022-10-07 MED ORDER — PHYTONADIONE 10 MG/ML IJ SOLN
10 MG/ML | Freq: Once | INTRAMUSCULAR | Status: AC
Start: 2022-10-07 — End: 2022-10-07
  Administered 2022-10-07: 05:00:00 10 mg via INTRAVENOUS

## 2022-10-07 MED ORDER — SODIUM CHLORIDE 0.9 % IV SOLN (MINI-BAG)
0.9 % | INTRAVENOUS | Status: DC
Start: 2022-10-07 — End: 2022-10-08
  Administered 2022-10-07 (×3): 8 mg/h via INTRAVENOUS

## 2022-10-07 MED ORDER — ALBUMIN HUMAN 5 % IV SOLN
5 | Freq: Once | INTRAVENOUS | Status: DC
Start: 2022-10-07 — End: 2022-10-08
  Administered 2022-10-07: 22:00:00 25 g via INTRAVENOUS

## 2022-10-07 MED ORDER — POTASSIUM CHLORIDE 20 MEQ/50ML IV SOLN
2050 MEQ/50ML | INTRAVENOUS | Status: DC | PRN
Start: 2022-10-07 — End: 2022-10-08

## 2022-10-07 MED ORDER — PRISMASATE BK 2/0 DIALYSIS SOLUTION
INTRAVENOUS_CENTRAL | Status: DC
Start: 2022-10-07 — End: 2022-10-08
  Administered 2022-10-07: 21:00:00 via INTRAVENOUS_CENTRAL

## 2022-10-07 MED ORDER — DEXTROSE 10 % IV SOLN
10 | INTRAVENOUS | Status: DC
Start: 2022-10-07 — End: 2022-10-07
  Administered 2022-10-07: 17:00:00 via INTRAVENOUS

## 2022-10-07 MED ORDER — GLUCAGON HCL (RDNA) 1 MG IJ SOLR
1 | INTRAMUSCULAR | Status: AC
Start: 2022-10-07 — End: 2022-10-06
  Administered 2022-10-07: 03:00:00 1

## 2022-10-07 MED ORDER — CALCIUM GLUCONATE-NACL 1-0.675 GM/50ML-% IV SOLN
1-0.67550- GM/50ML-% | INTRAVENOUS | Status: DC | PRN
Start: 2022-10-07 — End: 2022-10-08

## 2022-10-07 MED ORDER — SODIUM CHLORIDE 0.9 % IV SOLN
0.9 % | INTRAVENOUS | Status: DC
Start: 2022-10-07 — End: 2022-10-08

## 2022-10-07 MED ORDER — LACTULOSE 10 GM/15ML PO SOLN
10 | Freq: Three times a day (TID) | ORAL | Status: DC
Start: 2022-10-07 — End: 2022-10-08
  Administered 2022-10-07 (×2): 20 g via ORAL

## 2022-10-07 MED ORDER — PHENYLEPHRINE HCL 1 MG/10ML IV SOSY
110 MG/0ML | INTRAVENOUS | Status: DC
Start: 2022-10-07 — End: 2022-10-08

## 2022-10-07 MED ORDER — HYDROCORTISONE SOD SUC (PF) 100 MG IJ SOLR
100 | INTRAMUSCULAR | Status: AC
Start: 2022-10-07 — End: 2022-10-07
  Administered 2022-10-07: 05:00:00 100 mg via INTRAVENOUS

## 2022-10-07 MED FILL — SODIUM CHLORIDE 0.9 % IV SOLN: 0.9 % | INTRAVENOUS | Qty: 1000

## 2022-10-07 MED FILL — XIFAXAN 200 MG PO TABS: 200 MG | ORAL | Qty: 2

## 2022-10-07 MED FILL — VASOSTRICT 20 UNIT/ML IV SOLN: 20 UNIT/ML | INTRAVENOUS | Qty: 1

## 2022-10-07 MED FILL — XIFAXAN 550 MG PO TABS: 550 MG | ORAL | Qty: 1

## 2022-10-07 MED FILL — PROPOFOL 1000 MG/100ML IV EMUL: 1000 MG/100ML | INTRAVENOUS | Qty: 100

## 2022-10-07 MED FILL — NOREPINEPHRINE-SODIUM CHLORIDE 16-0.9 MG/250ML-% IV SOLN: INTRAVENOUS | Qty: 250

## 2022-10-07 MED FILL — ALBURX 5 % IV SOLN: 5 % | INTRAVENOUS | Qty: 500

## 2022-10-07 MED FILL — VANCOMYCIN HCL 1 G IV SOLR: 1 g | INTRAVENOUS | Qty: 1000

## 2022-10-07 MED FILL — OCTREOTIDE ACETATE 500 MCG/ML IJ SOLN: 500 MCG/ML | INTRAMUSCULAR | Qty: 1

## 2022-10-07 MED FILL — ERYTHROCIN LACTOBIONATE 500 MG IV SOLR: 500 MG | INTRAVENOUS | Qty: 500

## 2022-10-07 MED FILL — SODIUM CHLORIDE 0.9 % IV SOLN: 0.9 % | INTRAVENOUS | Qty: 500

## 2022-10-07 MED FILL — ACD FORMULA A 0.73-2.45-2.2 GM/100ML VI SOLN: Qty: 1000

## 2022-10-07 MED FILL — LACTULOSE 10 GM/15ML PO SOLN: 10 GM/15ML | ORAL | Qty: 30

## 2022-10-07 MED FILL — CHLORHEXIDINE GLUCONATE 0.12 % MT SOLN: 0.12 % | OROMUCOSAL | Qty: 15

## 2022-10-07 MED FILL — CEFTRIAXONE SODIUM 1 G IJ SOLR: 1 g | INTRAMUSCULAR | Qty: 1000

## 2022-10-07 MED FILL — PHYTONADIONE 10 MG/ML IJ SOLN: 10 MG/ML | INTRAMUSCULAR | Qty: 1

## 2022-10-07 MED FILL — PHENYLEPHRINE HCL-NACL 50-0.9 MG/250ML-% IV SOLN: INTRAVENOUS | Qty: 250

## 2022-10-07 MED FILL — PANTOPRAZOLE SODIUM 40 MG IV SOLR: 40 MG | INTRAVENOUS | Qty: 40

## 2022-10-07 MED FILL — GLUCAGEN HYPOKIT 1 MG IJ SOLR: 1 MG | INTRAMUSCULAR | Qty: 1

## 2022-10-07 MED FILL — PHENYLEPHRINE HCL 1 MG/10ML IV SOSY: 1 MG/0ML | INTRAVENOUS | Qty: 10

## 2022-10-07 MED FILL — ISOVUE-370 76 % IV SOLN: 76 % | INTRAVENOUS | Qty: 100

## 2022-10-07 MED FILL — EPINEPHRINE (ANAPHYLAXIS) 30 MG/30ML IJ SOLN: 30 MG/ML | INTRAMUSCULAR | Qty: 5

## 2022-10-07 MED FILL — SODIUM BICARBONATE 8.4 % IV SOLN: 8.4 % | INTRAVENOUS | Qty: 100

## 2022-10-07 MED FILL — SOLU-CORTEF 100 MG IJ SOLR: 100 MG | INTRAMUSCULAR | Qty: 2

## 2022-10-07 MED FILL — DEXTROSE 10 % IV SOLN: 10 % | INTRAVENOUS | Qty: 250

## 2022-10-07 MED FILL — CALCIUM CHLORIDE 10 % IV SOLN: 10 % | INTRAVENOUS | Qty: 80

## 2022-10-07 MED FILL — PANTOPRAZOLE SODIUM 40 MG IV SOLR: 40 MG | INTRAVENOUS | Qty: 80

## 2022-10-07 NOTE — Procedures (Signed)
[email or web address] [address]     Endoscopic Gastroduodenoscopy Procedure Note    Brianna Velazquez  05/17/1951  [id number]    Indication: Hematemesis    Operator: Baruch Gouty, MD    Referring Provider:  Wallace Going., APRN - NP    Anesthesia/Sedation:   per ICU      Procedure Details   Full disclosure of risks were reviewed with patient as detailed on the consent form. The patient was placed in the left lateral decubitus position and monitored with continuous interval blood pressure monitoring and direct observations.   After adequate sedation, the endoscope was carefully introduced into the oropharynx and passed in to the esophagus under direct visualization. The esophagus and GE junction were carefully examined. After advancement of the endoscope into the stomach, a careful examination was performed, including views of the antrum, incisura angularis, corpus and retroflexed views of the cardia and fundus.   The pylorus was then intubated without any difficulty and the endoscope was advanced to the second portion of the duodenum. Careful examination of the second portion of the duodenum and the bulb was then performed.  The stomach was then decompressed and the endoscope withdrawn.   Findings and intervention(s) are detailed below.     Findings:   Esophagus:  Mucosal tear with false lumen seen proximally to the UES, in the posterior wall:      Otherwise unable to evaluate mucosa due to large amount of clots and old blood.    Stomach:  Unable to evaluate mucosa due to large amount of clots and old blood.    Duodenum/small bowel:  Unable to evaluate mucosa due to large amount of clots and old blood.    Therapies:    none    Specimens: * No specimens *    Complications:   none; patient tolerated the procedure well.  EBL: 0 ml    Impression:  Poor visualization due to large amount of clots and old blood.   Mucosal tear with false  lumen seen proximally to the UES, in the posterior wall.    Recommendations:  Agree with octreotide gtt and SBP prophylaxis.  Please continue erythromycin IV TID as motility agent to help pass clots.  Needs IR consultation for possible TIPS if patient has recurrent evidence of bleeding.  Needs CTSurgery consultation due to the posterior pharynx/esophagus perforation found at the procedure. Patient is at high risk to develop mediastinitis, which would worsen the already grim prognosis.  Discussed case with POA (niece) in the ICU, answered questions. We will continue to follow closely.    Baruch Gouty, MD  10/07/2022  4:28 PM

## 2022-10-07 NOTE — Consults (Unsigned)
[facility]               [address] [place], [place]  [address]                              CONSULTATION      PATIENT NAME: Brianna Velazquez, Brianna Velazquez            DOB: 07-Sep-1950  MED REC NO: [id number]                       ROOM: 303  ACCOUNT NO: [id number]                       ADMIT DATE: October 10, 2022  PROVIDER: Asencion Partridge, MD    DATE OF SERVICE:  10/07/2022    ATTENDING PHYSICIAN:  HARESH PATEL    REASON FOR CONSULTATION:  Upper esophageal perforation and confusion.    HISTORY OF PRESENT ILLNESS:  The patient is a very sick 72 year old female who has had a past medical history significant for alcoholic cirrhosis, portal hypertension, as well as a history of esophageal varices, hepatic encephalopathy, hypertension, obstructive sleep apnea, schizoaffective disorder, and chronic thrombocytopenia.  The patient presented to the emergency room at [facility] with multiple episodes of hematemesis.  She denies any recent alcohol intake and was also receiving intermittent paracentesis due to ascites from her alcoholic cirrhosis.  While in the emergency room, the patient was noted to have significant acute blood loss with hemoglobin measured at 6.  The patient was also noted to have severe lactic acidosis at 20.  The patient was given vitamin K, Solu-Cortef, as well as octreotide drip.  CT scan of the abdomen revealed findings of gastritis, hyperemic small bowel mucosa, cirrhosis, and portal hypertension.  She was transported to the ICU, intubated for worsening encephalopathy.    The patient received multiple blood products, was noted to have significant coagulopathy and DIC, received FFP as well.    The patient underwent EGD, which revealed diffuse blood clots through the entire stomach and esophagus, which apparently she aspirated during the intubation.  EGD also showed a perforation of the posterior pharyngeal wall just above the upper esophageal sphincter, etiology unknown.  Suspect that this  may have been secondary to intubation.  CT Surgery was consulted.  The patient was not thought to be an operative candidate due to the above medical issues.  CT scan of the neck was subsequently obtained.  This was reviewed by me, which did indeed reveal esophageal perforation with active bleeding occurring at the right base of the neck arising from the subclavian artery.  ENT consultation was requested for evaluation regarding the above.    PHYSICAL EXAMINATION:  GENERAL:  Reveals a well-developed, well-nourished, intubated female looking *** sick.  I had seen her prior to her CT scan.   HEENT:  Examination reveals significant hematoma of the face and neck on the right side.  Flexible nasal laryngoscopy was performed.  The patient's nasopharynx, oropharynx, hypopharynx, and larynx were examined.  However, no evidence of any perforation was seen.  The exam, however, was suboptimal without any evidence of any other significant findings.  A large amount of blood was noted.  EXTREMITIES:  Unremarkable.  *** exam is within normal limits.    CT scan was reviewed.    DISCUSSION:  I have discussed the situation at length with Dr. Allena Katz.  The patient  does indeed have an esophageal perforation for which ideal treatment would be open incision and drainage, probably of both sides of the neck with Penrose drains for drainage to prevent any potential mediastinitis.  Due to the patient's underlying medical issues, however, I do not feel the patient is an optimal candidate.  Should the patient survive the above insults, high-dose antibiotics may be necessary in the hopes of a temporizing situation until the patient's medical issues improve for a possible drainage procedure.    At the present time, the patient's prognosis is extremely grim.    Of note, I will be going out of town within the next 12 hours.  If the patient's situation does not improve, then a surgical procedure of the cervical esophagus may be considered.  She will  need to be transferred to [facility].    Thank you for your consult.        Asencion Partridge, MD      PTM/AQS  D:  10/07/2022 22:41:19  T:  10/08/2022 01:20:25  JOB #:  [id number]

## 2022-10-07 NOTE — Progress Notes (Cosign Needed)
PCCM Update:    Patient became acutely hypotensive and subsequently suffered a PEA arrest. CRRT was stopped and blood was returned. ROSC was achieved. Patient's niece was updated extensively regarding patient's current critical condition and EGD findings. I explained patient remains at high risk for cardiac arrest in the near future.     Wayne Both, PA-C  10/07/22  Pulmonary, Critical Care Medicine  [facility]

## 2022-10-07 NOTE — ED Notes (Signed)
Per MD, patient not stable to go to CT until temp is greater than 97.38F.   Informed CT. Bear hugger and multiple warm blankets placed on patient

## 2022-10-07 NOTE — Progress Notes (Signed)
Chaplain responded to Death of  Brianna Velazquez, who was a 72 y.o.,female,     The Chaplain provided the following Interventions:  Provided crisis pastoral care, pastoral support and grief interventions.   Chart reviewed.      Plan:  Chaplains will continue to follow and will provide pastoral care on an as needed/requested basis and grief support for the family.      [place]   [phone]

## 2022-10-07 NOTE — H&P (Addendum)
[facility]  Pulmonary, Critical Care, and Sleep Medicine    Name: Brianna Velazquez MRN: [id number]   DOB: 11-10-1950 Hospital: [facility]   Date: 10/07/2022        Critical Care History and Physical      IMPRESSION:   Upper GI bleed: Presented with hematemesis, concerns for variceal bleed versus gastric ulcer.  Patient has history of gastric ulcer  Acute hypoxic and hypercarbic respiratory failure:  Intubated in the setting of profound lactic acidosis with need for emergent upper GI endoscopy for evaluation of esophageal varices vs perforation, possible aspiration event and concern for pneumonia  Shock, multifactorial: Septic and hemorrhagic  Renal failure- secondary to ATN vs Hepatorenal syndrome  Lactic acidosis  Acute encephalopathy, secondary to hepatic cirrhosis, hyperammonemia, hypoglycemia  Ascites, concern for possible SBP  Acute on chronic anemia, secondary to blood loss  Coagulopathy: Due to above  DIC  Hypoglycemia: Secondary to shock as noted above  Severe sepsis with shock: Etiology unclear, concerns for SBP, does have aspiration pneumonia (bronchoscopy from today showing aspirated blood) vs translocation of gut bacteria     Patient Active Problem List   Diagnosis    Cirrhosis of liver (HCC)    Psychosis ([place])    Chronic hepatitis C ([place])    Acute bronchitis    Chronic venous insufficiency    Pain of breast    Pain, lower leg    Right inguinal hernia    Umbilical hernia    Varicose vein of leg    Upper GI bleed    SBP (spontaneous bacterial peritonitis) ([place])    Aspiration into airway    Hepatic encephalopathy (HCC)    Hypoglycemia    Septic shock (HCC)        RECOMMENDATIONS:   Neuro: Titrate sedation to RASS of 0 to -1. PRN for breakthrough sedation needs.   Pulm: Titrate FiO2 for goal SPO2> 90%,VAP prevention bundle, head of the bed at 30' all times.Daily sedation holiday and assessment for weaning with SBT as tolerated.    PRN duonebs.   Supplemental O2 via NC,  titrate flow for goal SPO2> 90% Bronchodilators, steroids, pulmonary hygiene care, Aspiration precautions, Keep HOB >30 degrees  CVS :Actively titrate vasopressors aim MAP >60mHg, Check cardiac panel, ECHO results.   Fluids: NaCl 0.9%, received 2 units PRBC in the ED    GI: Diet NPO, continue octreotide, vitamin K, Protonix, lactulose. Plan for CT neck/chest/abd per GI bleed protocol.  Renal:  Trend Renal indices, Strict Is/Os, Foley (+), albumin 5% IV 25 g, plan for CRRT  Hem/Onc: Monitor for s/o active bleeding.   I/D:Sepsis bundle per hospital protocol, Blood, Sputum, and Urine cultures drawn and will be followed. Lactic acid ordered- initial and repeat Q4hrs till normalized. Trend WBCs and temperature curve. Abx started- ceftriaxone, erythromycin, vancomycin, rifaximin.  Endocrine: Q6 glucoses, SSI. Check TSH level  Metabolic:  Daily BMP, mag, phos. Trend lytes, replace as needed.   Musc/Skin: no acute issues, wound care  OB/GYN: HCG negative   Full Code No additional code details       Best practice : APPLICABLE TO PATIENT    Glycemic control  IHI ICU bundles:   Central Line Bundle Followed , Foley Bundle Followed, and Vent Bundle Followed, Vent Day intubated 10/07/22  Mech Vent patients-    VAP bundle-Cass tube to suction at 20-30 cm Hg, Maintain Cass tube with 5-147mair every 4 hours, Routine oral care every 4 hours, Elevation of head >  45 degree, Daily sedation holiday and SBT evaluation starting at 6.00am.  Stress ulcer prophylaxis.   Protonix  DVT prophylaxis.   Not indicated in the setting of elevated INR and active GI bleed  Need for Lines, foley assessed.  Palliative care evaluation.  Restraints not needed.          Subjective/History:     This patient has been seen and evaluated at the request of Dr. Quay Burow for ICU admission and continued management of ongoing upper GI bleeding and septic shock.    10/07/22    Patient is a 72 y.o. female history of alcoholic cirrhosis, recurrent ascites with regular  need for paracentesis, portal HTN, esophageal varices, GERD, hepatic encephalpathy, hepatitis C,hypertension, hypokalemia, hypomagnesemia obstructive sleep apnea, schizoaffective disorder, and thrombocytopenia. She presented to the [facility] due to ongoing nausea and vomiting. She was initially endorsing bright red blood and coffee ground emesis along with recent diarrhea which she noted also had bright red blood.    The patient is critically ill and can not provide additional history due to worsening mental status.   On arrival to ICU appeared more altered than initial exam. Patient not speaking in clear sentences.      Past Medical History:   Diagnosis Date    Ascites     Asthma     Back pain     Cirrhosis of liver ([place])     Depression     Esophageal varices with bleeding in diseases classified elsewhere [facility])     GERD (gastroesophageal reflux disease)     Hepatic encephalopathy (HCC)     Hepatitis C     treated with Epclusa 08/2020 per liver specialist note 01/17/21 care everywhere    Hypertension     Hypokalemia     Hypomagnesemia     OSA (obstructive sleep apnea)     no cpap    Schizoaffective disorder (HCC)     Thrombocytopenia (HCC)     Venous insufficiency         Past Surgical History:   Procedure Laterality Date    BACK SURGERY      part of hip removed right hip for back    COLONOSCOPY      ESOPHAGOGASTRODUODENOSCOPY      HYSTERECTOMY (CERVIX STATUS UNKNOWN)      KNEE ARTHROSCOPY Left     PARACENTESIS  10/02/2022    PR ABDOM PARACENTESIS DX/THER W/IMAGING GUIDANCE  10/07/2022    PR [place] W/BRNCL ALVEOLAR LAVAGE  10/07/2022    PR INSJ NON-TUNNELED CENTRAL VENOUS CATH AGE 38 YR/>  10/07/2022    TUBAL LIGATION          Prior to Admission medications    Medication Sig Start Date End Date Taking? Authorizing Provider   spironolactone (ALDACTONE) 50 MG tablet Take 1 tablet by mouth daily 08/28/22   [provider]   fluticasone (FLONASE) 50 MCG/ACT nasal spray 1 spray by Nasal route daily as needed    [provider]   XIFAXAN 550 MG tablet Take 1 tablet by mouth 2 times daily 03/05/22   Dossie Der, DO   propranolol (INDERAL) 10 MG tablet Take 1 tablet by mouth nightly 01/19/22   [provider]   pantoprazole (PROTONIX) 40 MG tablet Take 1 tablet by mouth every morning 08/25/19   [provider]   montelukast (SINGULAIR) 10 MG tablet Take 1 tablet by mouth as needed 01/04/22   [provider]   furosemide (LASIX) 40 MG  tablet Take 1 tablet by mouth daily 01/19/22   [provider]   QVAR REDIHALER 40 MCG/ACT AERB inhaler as needed 01/19/22   [provider]   albuterol (ACCUNEB) 1.25 MG/3ML nebulizer solution Inhale 3 mLs into the lungs as needed 10/12/19   [provider]   albuterol sulfate HFA (PROVENTIL;VENTOLIN;PROAIR) 108 (90 Base) MCG/ACT inhaler USE 1 INHALATION EVERY 4 TO 6 HOURS AS NEEDED 09/24/19   [provider]         Allergies   Allergen Reactions    Aspirin Anaphylaxis and Itching        Social History     Tobacco Use    Smoking status: Former     Current packs/day: 0.25     Types: Cigarettes    Smokeless tobacco: Never    Tobacco comments:     Stopped 1996     Smoked 25 years   Substance Use Topics    Alcohol use: Not Currently        No family history on file.       Review of Systems:  Review of systems not obtained due to patient factors.    Objective:   Vital Signs:    BP (!) 119/50   Pulse (!) 106   Temp 98.9 F (37.2 C)   Resp 24   Ht 1.6 m (5' 2.99")   Wt 60.7 kg (133 lb 13.1 oz)   SpO2 100%   BMI 23.71 kg/m             Temp (24hrs), Avg:97.4 F (36.3 C), Min:93.3 F (34.1 C), Max:99.4 F (37.4 C)       Intake/Output:   Last shift:      No intake/output data recorded.  Last 3 shifts: No intake/output data recorded.  No intake or output data in the 24 hours ending 10/07/22 1525        Physical Exam:     General:  Acutely ill, alert, minimally following commands   Head:  Normocephalic, without obvious abnormality,  atraumatic.   Eyes:  Conjunctivae/corneas clear. PERRL,   Nose: Nares normal. Septum midline. Mucosa normal. No drainage or sinus tenderness.    Throat: Lips, and tongue normal. Dark red blood around oral mucous membranes.Teeth and gums normal.   Neck: Supple, symmetrical, trachea midline, no adenopathy, thyroid: no enlargment/tenderness/nodules, no carotid bruit and no JVD.   Back:   Symmetric, no curvature. ROM normal.   Lungs:   Clear to auscultation bilaterally.   Chest wall:  No tenderness or deformity.   Heart:  Regular rate and rhythm, S1, S2 normal, no murmur, click, rub or gallop.   Abdomen:   Distended soft abdomen. Significant ascites, no palpable masses.   Extremities: Extremities normal, atraumatic, no cyanosis or edema. +/- restraints    Pulses: 2+ and symmetric all extremities.   Skin: Skin color, texture, turgor normal. No rashes or lesions   Lymph nodes:  Cervical, supraclavicular, and axillary nodes normal.   Neurologic: Grossly nonfocal     Devices:  ETT: 7.0 cuffed  OGT: none  Lines: Right femoral CVL, Right IJ HDC, Left radial arterial line  Drains: none  Foley: in place    Data:   I have independently reviewed and interpreted all labs, imaging data and additional testing relevant to patient's care and clinical decision making.      Lab Results   Component Value Date    WBC 13.6 (H) 10/07/2022    HGB 7.2 (L) 10/07/2022  HCT 21.4 (L) 10/07/2022    MCV 107.0 (H) 10/07/2022    PLT 72 (L) 10/07/2022    LYMPHOPCT 5 (L) 10/07/2022    RBC 2.00 (L) 10/07/2022    MCH 36.0 (H) 10/07/2022    MCHC 33.6 10/07/2022    RDW 16.2 (H) 10/07/2022     Lab Results   Component Value Date    NA 142 10/07/2022    K 4.9 10/07/2022    CL 104 10/07/2022    CO2 17 (L) 10/07/2022    BUN 43 (H) 10/07/2022    CREATININE 2.55 (H) 10/07/2022    GLUCOSE 62 (L) 10/07/2022    CALCIUM 7.7 (L) 10/07/2022    PROT 5.8 (L) 10/12/2022    LABALBU 1.9 (L) 09/29/2022    BILITOT 3.6 (H) 10/18/2022    ALKPHOS 108 10/12/2022    AST 117 (H)  10/24/2022    ALT 53 10/05/2022    LABGLOM 17 (L) 10/07/2022    AGRATIO 0.5 (L) 10/08/2022    GLOB 3.9 10/01/2022        Lab Results   Component Value Date    ALT 53 10/03/2022    AST 117 (H) 10/10/2022    ALKPHOS 108 10/16/2022    BILITOT 3.6 (H) 10/19/2022      Lab Results   Component Value Date    TSH3GEN 3.80 (H) 10/09/2022      No results found for: "LABA1C"    Lab Results   Component Value Date    LABALBU 1.9 (L) 10/25/2022       Results       Procedure Component Value Units Date/Time    Culture, Body Fluid [id number] Collected: 10/07/22 1205    Order Status: Sent Specimen: Pleural Fluid from Ascitic Fluid Updated: 10/07/22 1246    Culture, Respiratory [id number] Collected: 10/07/22 1140    Order Status: Sent Specimen: Bronchial Washing Updated: 10/07/22 1243    Blood Culture 1 [id number] Collected: 09/29/2022 2320    Order Status: Completed Specimen: Blood Updated: 10/07/22 0650     Special Requests NO SPECIAL REQUESTS        Culture NO GROWTH AFTER 6 HOURS       Blood Culture 2 [id number] Collected: 10/05/2022 2300    Order Status: Completed Specimen: Blood Updated: 10/07/22 0650     Special Requests NO SPECIAL REQUESTS        Culture NO GROWTH AFTER 6 HOURS                 Telemetry:normal sinus rhythm    Imaging:  I have personally reviewed the patient's radiographs and have reviewed the reports:    Recent image results  XR CHEST PORTABLE    Result Date: 10/07/2022  Support tubes in satisfactory position. No pneumothorax. Bilateral lower lobe infiltrates    XR CHEST PORTABLE    Result Date: 10/07/2022  No pneumothorax Right IJ approach central catheter in satisfactory position..      No results found for this or any previous visit.     Ultrasound Result (most recent):  US PELVIS LIMITED 09/25/2022    Narrative  EXAM: PELVIC ULTRASOUND-non-OB    CLINICAL INDICATION/HISTORY: Provided with order -   R19.00: Intra-abdominal and pelvic swelling, mass and lump, unspecified site  > Additional:  None    COMPARISON: None    TECHNIQUE: Real-time transabdominal imaging of the area of concern in the right groin with grayscale and color flow Doppler    _______________    FINDINGS:  Several left inguinal lymph nodes are noted none of which are significantly enlarged. There is what appears to be subcutaneous edema. No nodes noted on the right. Bilateral inguinal hernias containing ascites. No evidence of bowel.    _______________    Impression  Bilateral inguinal hernias containing ascites but no bowel. Several grossly normal left inguinal lymph nodes    Signed By: Irena Cords, MD on 09/27/2022 12:26 PM      No results found for this or any previous visit.              Rea [employer], DO  [facility] PGY1  10/07/22        Attending attestation:  I saw and evaluated the patient, performing the key elements of the service.  I discussed the findings, assessment and plan with the resident and agree with the resident's findings and plan as documented in the resident's note.    Total of 234 min critical care time spent at bedside (personally) during the course of care providing evaluation,management and care decisions and ordering appropriate treatment related to critical care problems exclusive of procedures.  The reason for providing this level of medical care for this critically ill patient was due a critical illness that impaired one or more vital organ systems such that there was a high probability of imminent or life threatening deterioration in the patients condition. This care involved high complexity decision making to assess, manipulate, and support vital system functions, to treat this degree vital organ system failure and to prevent further life threatening deterioration of the patient's condition.  This time was independent of other practitioners.    72 year old female with a past medical history of alcoholic cirrhosis, portal hypertension, history of esophageal varices, hepatic  encephalopathy, Jehovah's Witness, hypertension, OSA, schizoaffective disorder, chronic thrombocytopenia, presented to [facility] with multiple episodes of hematemesis and hematochezia.  Patient denied any recent alcohol intake.  Patient was getting recurrent paracenteses, last paracentesis was on 10/02/2022 due to refractory ascites.  Patient had an IR evaluation for TIPS.  Unfortunately while in the ER, patient was found to have acute blood loss anemia with a hemoglobin of 6, as well as severe lactic acidosis with a lactic acid of 19.8, and hyperglycemic with a glucose of 20.  Patient was given vitamin K, Solu-Cortef, started on a PPI drip, and octreotide drip was ordered (was not available from pharmacy, only came after patient was in the ICU and intubated).  CT abdomen pelvis was ordered, however not performed.  Patient was transferred to the ICU, intubated for worsening encephalopathy as well as need for needing EGD, and severe lactic acidosis.  Patient unfortunately developed worsening shock, combination of septic and hemorrhagic shock, patient received multiple blood products.  Patient also found to have severe coagulopathy and DIC, given FFP and cryoprecipitate.  Right IJV CVL was placed in ER but not sutured properly with line pulled out and dressing up so additional femoral CVL  was placed in right, of note, pt has signfiicant anasarca with ascitic fluid in medial inguinal region.  Due to severe lactic acidosis, patient was started on bicarb drip, had persistent lactic acidosis, likely not clearing due to hepatic failure (acute on chronic) in the setting of decompensated cirrhosis.  Decision was made to then consult nephrology and start patient on CRRT.  Given groin ascites, decision was made to place right IJV temp HD catheter.  Patient unable to tolerate due to refractory vasoplegia.  Patient also underwent  EGD which showed diffuse blood clots throughout entire stomach and esophagus which she  had aspirated during intubation.  EGD also showed perforation of posterior pharyngeal wall just above upper esophageal sphincter.  Patient went bronchoscopy which showed evidence of aspiration of old blood.  Discussed with GI, consulted CT surgery-spoke with Dr. Sherlie Ban, patient not an operative candidate, high risk for mediastinitis, CT neck ordered.  Also consult ENT, spoke with Dr. Hassell Done, noted that if perforation is present, patient would need surgical drain placement, however given severe liver disease (MELD NA of 33), patient is a nonoperative candidate and high risk of morbidity.  CT of neck as well as chest, and bleeding scan of abdomen pelvis was performed, no active bleeding seen in abdomen, however CT of neck showed right subclavian artery hematoma with extravasation of contrast.  Line looks in place, extravasation not from vein per radiologist, appears arterial in nature.  I suspect bleeding is from patient's coagulopathy and DIC given multiple areas of bleeding including GI bleed and pt oozing from all venipuncture sites.  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  Continue supportive care    Prognosis is moribund        Mellody Memos  MD/MPH     Pulmonary, Critical Care Medicine  [facility]

## 2022-10-07 NOTE — Procedures (Signed)
Pulmonary, Critical Care, and Sleep Medicine    Name: Brianna Velazquez MRN: [id number]   DOB: January 19, 1951 Hospital: [facility]   Date: 10/07/2022        Intubation Note    Procedure: elective intubation    Indication: respiratory insufficiency    Anesthesia- Rapid sequence:  Etomidate '20mg'$   Paralysis: Rocuronium '80mg'$     After assessing the airway, the patient underwent preoxygenation with 100% FiO2 for 5 min. Etomidate and rocuronium was given sequentially in rapid IV push.  The Sellick maneuver was performed throughout the entire sequence.  After adequate sedation and paralisys emergent intubation was performed.      A S3 glidescope was used to visualize the epiglottis and vocal chords.    After positive identification of the vocal chords, an 7.0 ET tube was placed into the trachea with direct visualization.    The tube was seen passing through the vocal chords without some difficulty.  CO2 colorimetry was employed immediately to verify tube in airway with appropriate color change indicating detection of CO2.    Water vapor was seen within the ET tube, and auscultation of the abdomen revealed no bubbling souds.    Auscultation  and inspection of the chest after intubation showed symmetric chest excursion and symmetric air entry bilaterally.    Chest X-ray has been ordered and is pending.    The patient has been placed on a mechanical ventilator.    There were no complications.    Marcelline Mates, PA-C   Supervised by Dr. Mellody Memos

## 2022-10-07 NOTE — Progress Notes (Signed)
Chaplain responded to Code Blue for  [employer], who is a 72 y.o.,female,     The Chaplain provided the following Interventions:  Provided crisis pastoral care and pastoral support.   Offered prayers on behalf for the patient.   Chart reviewed.    The following outcomes were achieved:  Patient was successfully resuscitated.    Plan:  Chaplains will continue to follow and will provide pastoral care on an as needed/requested basis.  Chaplain recommends bedside caregivers page chaplain on duty if patient shows signs of acute spiritual or emotional distress.       [place]   [phone]

## 2022-10-07 NOTE — Consults (Signed)
Consult Note      Consult requested by: Mellody Memos, MD    ADMIT DATE: 10/25/2022  CONSULT DATE: October 07, 2022                 Admission diagnosis: Upper GI bleed   Reason for Nephrology Consultation: AKI , metabolic acidoses       Assessment:   #1 acute kidney injury, oliguric in the setting of hemorrhagic and septic shock  Versus hepatorenal syndrome  #2 acidemia with anion gap metabolic acidosis with lactic acidosis  #3 hemorrhagic shock versus septic shock  #4 decompensated liver failure with underlying alcoholic cirrhosis with portal hypertension and esophageal varices  #5 hepatic encephalopathy  #6 concern for SBP  #7 severe anemia due to acute blood loss  #8 patient being [employer]'s [employer]  #9 hyperammonemia    Plan:    #1 start CRRT with citrate anticoagulation  #2 continue bicarb drip for now  #3 monitor electrolytes and renal parameters per protocol  #4 presently no UF on CRRT  #5 keep MAP greater than 65  #6 continue octreotide and pressors  #7 urine studies ordered  #8 nonemergent renal ultrasound    Prognosis guarded  Discussed plan with primary team    Please call with questions    Sabino Gasser, MD FASN  Cell PM:[id number]  Pager: [phone]  Fax   [phone]       HPI: Brianna Velazquez is a 72 y.o. female Black / African American with history of alcoholic cirrhosis with portal hypertension, history of esophageal varices, hide hepatic encephalopathy, shock presented with GI bleed and blood loss anemia.  Patient has history of refractory ascites being on Lasix and spironolactone.  Noted to have a AKI on presentation as well as severe metabolic acidosis with lactic acid of 218.  Nephrology was consulted to manage AKI and severe acidosis       Past Medical History:   Diagnosis Date    Ascites     Asthma     Back pain     Cirrhosis of liver ([place])     Depression     Esophageal varices with bleeding in diseases classified elsewhere [facility])     GERD (gastroesophageal reflux disease)     Hepatic  encephalopathy ([place])     Hepatitis C     treated with Epclusa 08/2020 per liver specialist note 01/17/21 care everywhere    Hypertension     Hypokalemia     Hypomagnesemia     OSA (obstructive sleep apnea)     no cpap    Schizoaffective disorder (HCC)     Thrombocytopenia (HCC)     Venous insufficiency       Past Surgical History:   Procedure Laterality Date    BACK SURGERY      part of hip removed right hip for back    COLONOSCOPY      ESOPHAGOGASTRODUODENOSCOPY      HYSTERECTOMY (CERVIX STATUS UNKNOWN)      KNEE ARTHROSCOPY Left     PARACENTESIS  10/02/2022    PR ABDOM PARACENTESIS DX/THER W/IMAGING GUIDANCE  10/07/2022    PR [place] W/BRNCL ALVEOLAR LAVAGE  10/07/2022    PR [facility]J NON-TUNNELED CENTRAL VENOUS CATH AGE 80 YR/>  10/07/2022    TUBAL LIGATION         Social History     Socioeconomic History    Marital status: Single     Spouse name: Not on file    Number of  children: Not on file    Years of education: Not on file    Highest education level: Not on file   Occupational History    Not on file   Tobacco Use    Smoking status: Former     Current packs/day: 0.25     Types: Cigarettes    Smokeless tobacco: Never    Tobacco comments:     [place] 25 years   Vaping Use    Vaping Use: Never used   Substance and Sexual Activity    Alcohol use: Not Currently    Drug use: Never    Sexual activity: Not Currently   Other Topics Concern    Not on file   Social History Narrative    Not on file     Social Determinants of Health     Financial Resource Strain: High Risk (02/21/2022)    Overall Financial Resource Strain (CARDIA)     Difficulty of Paying Living Expenses: Hard   Food Insecurity: Not on file (02/21/2022)   Recent Concern: Food Insecurity - Food Insecurity Present (02/21/2022)    Hunger Vital Sign     Worried About Running Out of Food in the Last Year: Sometimes true     Ran Out of Food in the Last Year: Sometimes true   Transportation Needs: Unmet Transportation Needs (02/21/2022)    PRAPARE -  Armed forces logistics/support/administrative officer (Medical): Not on file     Lack of Transportation (Non-Medical): Yes   Physical Activity: Inactive (03/21/2022)    Exercise Vital Sign     Days of Exercise per Week: 0 days     Minutes of Exercise per Session: 0 min   Stress: Not on file   Social Connections: Not on file   Intimate Partner Violence: Not on file   Housing Stability: Unknown (02/21/2022)    Housing Stability Vital Sign     Unable to Pay for Housing in the Last Year: Not on file     Number of [place] in the Last Year: Not on file     Unstable Housing in the Last Year: No       No family history on file.  Allergies   Allergen Reactions    Aspirin Anaphylaxis and Itching        Home Medications:   '@PTAMEDSBSHSI'$ @    Current Inpatient Medications:     Current Facility-Administered Medications   Medication Dose Route Frequency    norepinephrine (LEVOPHED) 16 mg in sodium chloride 0.9 % 250 mL infusion  1-100 mcg/min IntraVENous Continuous    octreotide (SANDOSTATIN) 500 mcg in sodium chloride 0.9 % 100 mL infusion  25 mcg/hr IntraVENous Continuous    0.9 % sodium chloride infusion   IntraVENous PRN    lactulose (CHRONULAC) 10 GM/15ML solution 20 g  20 g Oral TID    erythromycin (ERYTHROCIN) 500 mg in sodium chloride 0.9 % 250 mL IVPB  500 mg IntraVENous NOW    rifAXIMin (XIFAXAN) tablet 550 mg  550 mg Oral BID    propofol infusion  5-50 mcg/kg/min IntraVENous Continuous    norepinephrine (LEVOPHED) 16 mg in sodium chloride 0.9 % 250 mL infusion  1-100 mcg/min IntraVENous Continuous    hydrocortisone sodium succinate PF (SOLU-CORTEF) injection 50 mg  50 mg IntraVENous Q6H    chlorhexidine (PERIDEX) 0.12 % solution 15 mL  15 mL Mouth/Throat BID    fentaNYL (SUBLIMAZE) injection 100 mcg  100  mcg IntraVENous Q15 Min PRN    midazolam PF (VERSED) injection 2 mg  2 mg IntraVENous Q15 Min PRN    vasopressin 20 Units in sodium chloride 0.9 % 100 mL infusion  0.03 Units/min IntraVENous Continuous    EPINEPHrine 5 mg in  sodium chloride 0.9 % 250 mL infusion  1-10 mcg/min IntraVENous Continuous    albumin human 5% IV solution 25 g  25 g IntraVENous Once    citrate dextrose (ACD Formula A) 0.73-2.45-2.2 GM/100ML solution   IntraCATHeter Continuous    And    calcium chloride 8,000 mg in sodium chloride 0.9 % 1,000 mL infusion  8 g IntraVENous Continuous    potassium chloride 20 mEq/50 mL IVPB (Central Line)  20 mEq IntraVENous PRN    magnesium sulfate 1000 mg in dextrose 5% 100 mL IVPB  1,000 mg IntraVENous PRN    calcium gluconate 1,000 mg in sodium chloride 50 mL  1,000 mg IntraVENous PRN    Or    calcium gluconate 2,000 mg in sodium chloride 0.9 % 100 mL IVPB  2,000 mg IntraVENous PRN    Or    calcium gluconate 3,000 mg in sodium chloride 0.9 % 100 mL IVPB  3,000 mg IntraVENous PRN    Or    calcium gluconate 4,000 mg in sodium chloride 0.9 % 100 mL IVPB  4,000 mg IntraVENous PRN    sodium phosphate 6 mmol in sodium chloride 0.9 % 250 mL IVPB  6 mmol IntraVENous PRN    Or    sodium phosphate 12 mmol in sodium chloride 0.9 % 250 mL IVPB  12 mmol IntraVENous PRN    Or    sodium phosphate 18 mmol in sodium chloride 0.9 % 500 mL IVPB  18 mmol IntraVENous PRN    Or    sodium phosphate 24 mmol in sodium chloride 0.9 % 500 mL IVPB  24 mmol IntraVENous PRN    prismaSol BGK 2/3.5 dialysis solution   Dialysis Continuous    prismaSATE BK 2/0 dialysis solution   Dialysis Continuous    metoclopramide (REGLAN) injection 10 mg  10 mg IntraVENous Once    sodium bicarbonate 100 mEq in sterile water 1,000 mL infusion   IntraVENous Continuous    0.9 % sodium chloride infusion   IntraVENous PRN    cefTRIAXone (ROCEPHIN) 1,000 mg in sterile water 10 mL IV syringe  1,000 mg IntraVENous Q24H    pantoprazole (PROTONIX) 40 mg in sodium chloride 0.9 % 50 mL (mini-bag) infusion  8 mg/hr IntraVENous Continuous    vancomycin (VANCOCIN) intermittent dosing (placeholder)   Other RX Placeholder       Review of Systems:   No fever or chills. No sore throat. No  cough or hemoptysis. No shortness of breath or chest pain. No orthopnea or paroxysmal nocturnal dyspnea. Good appetite. No nausea, vomiting, abdominal pain, melena or hematochezia. No constipation or diarrhea. No dysuria, no gross hematuria of voiding difficulties. No ankle swelling, no joint paints. No muscle aches. No skin changes. No dizziness or lightheadedness. No headaches.       Physical Assessment:     Vitals:    10/07/22 0930 10/07/22 1121 10/07/22 1520 10/07/22 1539   BP:   (!) 119/50    Pulse:  (!) 122 (!) 106 (!) 109   Resp:  '16 24 24   '$ Temp: 98.9 F (37.2 C)      TempSrc:       SpO2:  100% 100% 100%   Weight:  60.7 kg (133 lb 13.1 oz)     Height:  1.6 m (5' 2.99")       '@WEIGHTLAST3IP'$ @  Admission weight: Weight - Scale: 60.7 kg (133 lb 12.8 oz) (10/04/2022 2154)    No intake or output data in the 24 hours ending 10/07/22 1633    Intubated sedated  Neck: no cervical lymphadenopathy or thyromegaly.   Lungs: Bilateral coarse breath sounds  Cardiovascular system: S1, S2, regular rate and rhythm.  Abdomen: soft, non tender, non distended. Positive bowel sounds. No hepatosplenomegaly. No abdominal bruits.   Extremities: no clubbing, cyanosis or edema.     Data Review:    Labs: Results:       Chemistry Recent Labs     09/28/2022  2219 10/07/22  0700   NA 144 142   K 4.0 4.9   CL 100 104   CO2 18* 17*   BUN 41* 43*   GLOB 3.9  --    PHOS  --  6.4*         CBC w/Diff Recent Labs     10/08/2022  2219 10/07/22  0321 10/07/22  0700   WBC 16.9* 14.5* 13.6*   RBC 2.41* 1.67* 2.00*   HGB 8.7* 6.0* 7.2*   HCT 26.5* 18.3* 21.4*   PLT 63* 52* 72*         Iron/Ferritin No results for input(s): "IRON" in the last 72 hours.    Invalid input(s): "TIBCCALC"   PTH/VIT D No results for input(s): "PTH" in the last 72 hours.    Invalid input(s): "VITD"           Brianna Payor, MD  10/07/2022  4:33 PM      October 07, 2022

## 2022-10-07 NOTE — ACP (Advance Care Planning) (Signed)
There is no advance directive on file for this patient and patient is not alert to communicate with. Patient seen in the emergency room where she will be admitted to the hospital due to a GI Bleed. There is a contact person listed in chart.

## 2022-10-07 NOTE — Progress Notes (Signed)
@  1608 arrived bedside to inititate CRRT.  Informed by primary RN patient is going to CT first then CRRT can be initiated.    Primary RN will contact HD nurse upon patient return from CT.

## 2022-10-07 NOTE — Procedures (Signed)
[facility]  Arterial  Line Procedure Note with Ultrasound    Indication: Shock, need for frequent ABGs    Risks, benefits, alternatives explained and consent obtained.      Line Bundle:  Full sterile barrier precautions used.  7-Step Sterility Protocol followed.  (cap, mask sterile gown, sterile gloves, large sterile sheet, hand hygiene, 2% chlorhexidine for cutaneous antisepsis)  5 mL 1% Lidocaine placed at insertion site.        RIGHT/LEFT: left radial artery cannulated x 2 attempt(s) utilizing the modified Seldinger technique.    Good blood return.  Catheter secured & Biopatch applied.  Sterile Tegaderm placed.          Due to technical limitations, US imaging was used but unable to be uploaded into EMR      Mellody Memos  MD/MPH     Pulmonary, [facility]

## 2022-10-07 NOTE — ED Notes (Signed)
CT on hold per provider.

## 2022-10-07 NOTE — Discharge Summary (Signed)
Death Discharge Summary    Patient: Brianna Velazquez               Sex: female          DOA: 10/22/2022         Date of Birth:  1951-03-26      Age:  72 y.o.        LOS:  LOS: 0 days                Admit Date: 10/27/2022    Discharge Date: 10/07/2022    Admission Diagnoses: see below     Final Discharge Diagnoses:    Cardiac arrest x2 due to below   Upper GI bleed: Presented with hematemesis, concerns for variceal bleed versus gastric ulcer.  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 and Esophageal perforation   Acute hypoxic and hypercarbic respiratory failure:  Intubated in the setting of profound lactic acidosis with need for emergent upper GI endoscopy for evaluation of esophageal varices vs perforation, possible aspiration event and concern for pneumonia  Shock, multifactorial: Septic and hemorrhagic  Renal failure- secondary to ATN vs Hepatorenal syndrome  Lactic acidosis  Acute encephalopathy, secondary to hepatic cirrhosis, hyperammonemia, hypoglycemia  Ascites, concern for possible SBP  Acute on chronic anemia, secondary to blood loss  Coagulopathy: Due to above  DIC  Hypoglycemia: Secondary to shock as noted above  Severe sepsis with shock: Etiology unclear, concerns for SBP, does have aspiration pneumonia (bronchoscopy from today showing aspirated blood) vs translocation of gut bacteria      Hospital Course:  72 year old female with a past medical history of alcoholic cirrhosis, portal hypertension, history of esophageal varices, hepatic encephalopathy, Jehovah's Witness, hypertension, OSA, schizoaffective disorder, chronic thrombocytopenia, presented to [facility] with multiple episodes of hematemesis and hematochezia.  Patient denied any recent alcohol intake.  Patient was getting recurrent paracenteses, last paracentesis was on 10/02/2022 due to refractory ascites.  Patient had an IR evaluation for TIPS.  Unfortunately while  in the ER, patient was found to have acute blood loss anemia with a hemoglobin of 6, as well as severe lactic acidosis with a lactic acid of 19.8, and hyperglycemic with a glucose of 20.  Patient was given vitamin K, Solu-Cortef, started on a PPI drip, and octreotide drip was ordered (was not available from pharmacy, only came after patient was in the ICU and intubated).  CT abdomen pelvis was ordered with findings of gastritis, hyperemic small bowel mucosa, cirrhosis and portal hypertension.   Patient was transferred to the ICU, intubated for worsening encephalopathy as well as need for needing EGD, and severe lactic acidosis.  Patient unfortunately developed worsening shock, combination of septic and hemorrhagic shock, patient received multiple blood products.  Patient also found to have severe coagulopathy and DIC, given FFP and cryoprecipitate.  Due to severe lactic acidosis, patient was started on bicarb drip, had persistent lactic acidosis, likely not clearing due to hepatic failure (acute on chronic) in the setting of decompensated cirrhosis.  Decision was made to then consult nephrology and start patient on CRRT.  Patient unable to tolerate due to refractory vasoplegia.  Patient also underwent EGD which showed diffuse blood clots throughout entire stomach and esophagus which she had aspirated during intubation.  EGD also showed perforation of posterior pharyngeal wall just above upper esophageal sphincter.  Patient underwent bronchoscopy which showed evidence of aspiration of old blood.  Discussed with GI,  consulted CT surgery-spoke with Dr. Nevada Crane, patient not an operative candidate, high risk for mediastinitis, CT neck ordered with findings of esophageal perforation and active bleeding occurring at right base of neck appearing to arise from the right subclavian artery. There was also an ill-defined collection representing a hematoma near this area.  Also consult ENT, spoke with Dr. Hassell Done, noted that if  perforation is present, patient would need surgical drain placement, however given severe liver disease (MELD NA of 33), patient is a nonoperative candidate and high risk of morbidity.  CT of neck as well as chest, and bleeding scan of abdomen pelvis was performed, no active bleeding seen in abdomen, however CT of neck showed right subclavian artery hematoma with extravasation of contrast.  Line looks in place, extravasation not from vein per radiologist, appears arterial in nature. suspected bleeding is from patient's coagulopathy and DIC given multiple areas of bleeding including GI bleed and pt oozing from all venipuncture sites.  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  Patient suffered another PEA cardiac arrest at 2030. She received Epi x1 and family decided to to withdrawal CPR due to poor prognosis. Time of death 2129/10/31 October 25, 2022.       Significant Diagnostic Studies:   Recent Results (from the past 24 hour(s))   CBC with Auto Differential    Collection Time: 10/04/2022 10:19 PM   Result Value Ref Range    WBC 16.9 (H) 4.6 - 13.2 K/uL    RBC 2.41 (L) 4.20 - 5.30 M/uL    Hemoglobin 8.7 (L) 12.0 - 16.0 g/dL    Hematocrit 26.5 (L) 35.0 - 45.0 %    MCV 110.0 (H) 78.0 - 100.0 FL    MCH 36.1 (H) 24.0 - 34.0 PG    MCHC 32.8 31.0 - 37.0 g/dL    RDW 15.9 (H) 11.6 - 14.5 %    Platelets 63 (L) 135 - 420 K/uL    MPV 11.9 (H) 9.2 - 11.8 FL    Nucleated RBCs 2.0 (H) 0 PER 100 WBC    nRBC 0.34 (H) 0.00 - 0.01 K/uL    Neutrophils % 88 (H) 40 - 73 %    Lymphocytes % 4 (L) 21 - 52 %    Monocytes % 7 3 - 10 %    Eosinophils % 0 0 - 5 %    Basophils % 0 0 - 2 %    Immature Granulocytes 1 (H) 0.0 - 0.5 %    Neutrophils Absolute 14.9 (H) 1.8 - 8.0 K/UL    Lymphocytes Absolute 0.7 (L)  0.9 - 3.6 K/UL    Monocytes Absolute 1.1 0.05 - 1.2 K/UL    Eosinophils Absolute 0.0 0.0 - 0.4 K/UL    Basophils Absolute 0.0 0.0 - 0.1 K/UL    Absolute Immature Granulocyte 0.2 (H) 0.00 - 0.04 K/UL    Differential Type AUTOMATED     CMP    Collection Time: 09/28/2022 10:19 PM   Result Value Ref Range    Sodium 144 136 - 145 mmol/L    Potassium 4.0 3.5 - 5.5 mmol/L    Chloride 100 100 - 111 mmol/L    CO2 18 (L) 21 - 32 mmol/L    Anion Gap 26 (H) 3.0 - 18 mmol/L  Glucose 39 (LL) 74 - 99 mg/dL    BUN 41 (H) 7.0 - 18 MG/DL    Creatinine 2.83 (H) 0.6 - 1.3 MG/DL    Bun/Cre Ratio 14 12 - 20      Est, Glom Filt Rate 17 (L) >60 ml/min/1.93m    Calcium 8.8 8.5 - 10.1 MG/DL    Total Bilirubin 3.6 (H) 0.2 - 1.0 MG/DL    ALT 53 13 - 56 U/L    AST 117 (H) 10 - 38 U/L    Alk Phosphatase 108 45 - 117 U/L    Total Protein 5.8 (L) 6.4 - 8.2 g/dL    Albumin 1.9 (L) 3.4 - 5.0 g/dL    Globulin 3.9 2.0 - 4.0 g/dL    Albumin/Globulin Ratio 0.5 (L) 0.8 - 1.7     ETOH    Collection Time: 10/21/2022 10:19 PM   Result Value Ref Range    Ethanol Lvl <3 0 - 3 MG/DL   Lipase    Collection Time: 10/26/2022 10:19 PM   Result Value Ref Range    Lipase 35 13 - 75 U/L   TYPE AND SCREEN    Collection Time: 10/15/2022 10:19 PM   Result Value Ref Range    Crossmatch expiration date 10/09/2022,2359     ABO/Rh O POSITIVE     Antibody Screen NEG     Unit Number [id number]    Product Code Blood Bank RC LR     Unit Divison 00     Dispense Status Blood Bank ISSUED     Unit Issue Date/Time [id number]    Product Code Blood Bank [id number]     Blood Bank Unit Type and Rh O POS     Blood Bank ISBT Product Blood Type 5100     Blood Bank Blood Product Expiration Date 2Y5278638    Crossmatch Result Compatible     Unit Number [id number]    Product Code Blood Bank RC LR     Unit Divison 00     Dispense Status Blood Bank ISSUED     Unit Issue Date/Time 0K2317678    Product Code Blood Bank [id number]     Blood Bank Unit Type and Rh O POS     Blood Bank ISBT  Product Blood Type 5100     Blood Bank Blood Product Expiration Date 2V7204091    Crossmatch Result Compatible     Unit Number [id number]    Product Code Blood Bank RC LR     Unit Divison 00     Dispense Status Blood Bank ISSUED     Unit Issue Date/Time 0Z3484613    Product Code Blood Bank E0336V00     Blood Bank Unit Type and Rh O POS     Blood Bank ISBT Product Blood Type 5100     Blood Bank Blood Product Expiration Date 2OJ:9815929    Crossmatch Result Compatible     Unit Number [id number]    Product Code Blood Bank RC LR,1     Unit Divison 00     Dispense Status Blood Bank ISSUED     Unit Issue Date/Time 0Z3484613    Product Code Blood Bank [id number]    Blood Bank Unit Type and Rh O POS     Blood Bank ISBT Product Blood Type 5100     Blood Bank Blood Product Expiration Date 2OJ:9815929    Crossmatch Result Compatible  Unit Number [id number]     Product Code Blood Bank RC LR,1     Unit Divison 00     Dispense Status Blood Bank ISSUED     Unit Issue Date/Time Q508461     Product Code Blood Bank [id number]     Blood Bank Unit Type and Rh O POS     Blood Bank ISBT Product Blood Type 5100     Blood Bank Blood Product Expiration Date EF:7732242     Crossmatch Result Compatible     Unit Number [id number]     Product Code Blood Bank RC LR     Unit Divison 00     Dispense Status Blood Bank ALLOCATED     Crossmatch Result Compatible     Unit Number [id number]     Product Code Blood Bank RC LR,2     Unit Divison 00     Dispense Status Blood Bank ALLOCATED     Crossmatch Result Compatible    Protime-INR    Collection Time: 10/13/2022 10:19 PM   Result Value Ref Range    Protime 31.7 (H) 11.9 - 14.7 sec    INR 3.0 (H) 0.9 - 1.1     APTT    Collection Time: 10/07/2022 10:19 PM   Result Value Ref Range    APTT 40.8 (H) 23.0 - 36.4 SEC   TSH    Collection Time: 10/15/2022 10:19 PM   Result Value Ref Range    TSH, 3RD GENERATION 3.80 (H) 0.36 - 3.74 uIU/mL   T4, Free    Collection Time:  10/10/2022 10:19 PM   Result Value Ref Range    T4 Free 1.3 0.7 - 1.5 NG/DL   EKG 12 Lead    Collection Time: 10/03/2022 10:30 PM   Result Value Ref Range    Ventricular Rate 80 BPM    Atrial Rate 80 BPM    P-R Interval 114 ms    QRS Duration 72 ms    Q-T Interval 450 ms    QTc Calculation (Bazett) 519 ms    P Axis 63 degrees    R Axis 60 degrees    T Axis 36 degrees    Diagnosis       Sinus rhythm with premature supraventricular complexes  ST abnormality, possible digitalis effect  Prolonged QT  Abnormal ECG  No previous ECGs available  Confirmed by Boyd Kerbs, MD, Jun ([id number]) on 10/07/2022 10:24:52 AM     POCT Glucose    Collection Time: 10/09/2022 10:49 PM   Result Value Ref Range    POC Glucose <20.0 (LL) 70 - 110 mg/dL   POCT Glucose    Collection Time: 10/17/2022 10:56 PM   Result Value Ref Range    POC Glucose 43 (LL) 70 - 110 mg/dL   Blood Culture 2    Collection Time: 10/25/2022 11:00 PM    Specimen: Blood   Result Value Ref Range    Special Requests NO SPECIAL REQUESTS      Culture NO GROWTH AFTER 6 HOURS     Blood Culture 1    Collection Time: 10/20/2022 11:20 PM    Specimen: Blood   Result Value Ref Range    Special Requests NO SPECIAL REQUESTS      Culture NO GROWTH AFTER 6 HOURS     POCT Glucose    Collection Time: 10/09/2022 11:26 PM   Result Value Ref Range    POC Glucose 20 (LL) 70 - 110 mg/dL   Lactic  Acid    Collection Time: 10/18/2022 11:30 PM   Result Value Ref Range    Lactic Acid, Plasma 19.8 (HH) 0.4 - 2.0 MMOL/L   POCT Glucose    Collection Time: 10/07/22 12:26 AM   Result Value Ref Range    POC Glucose 151 (H) 70 - 110 mg/dL   POCT Glucose    Collection Time: 10/07/22 12:51 AM   Result Value Ref Range    POC Glucose 139 (H) 70 - 110 mg/dL   Ammonia    Collection Time: 10/07/22  1:21 AM   Result Value Ref Range    Ammonia 236 (H) 11 - 32 UMOL/L   Lactic Acid    Collection Time: 10/07/22  2:40 AM   Result Value Ref Range    Lactic Acid, Plasma 21.7 (HH) 0.4 - 2.0 MMOL/L   Urinalysis    Collection Time: 10/07/22   3:21 AM   Result Value Ref Range    Color, UA DARK YELLOW      Appearance CLOUDY      Specific Gravity, UA 1.017 1.005 - 1.030      pH, Urine 5.0 5.0 - 8.0      Protein, UA 30 (A) NEG mg/dL    Glucose, UA Negative NEG mg/dL    Ketones, Urine Negative NEG mg/dL    Bilirubin Urine SMALL (A) NEG      Blood, Urine SMALL (A) NEG      Urobilinogen, Urine 1.0 0.2 - 1.0 EU/dL    Nitrite, Urine Negative NEG      Leukocyte Esterase, Urine TRACE (A) NEG     Urine Drug Screen    Collection Time: 10/07/22  3:21 AM   Result Value Ref Range    Benzodiazepines, Urine Negative NEG      Barbiturates, Urine Negative NEG      THC, TH-Cannabinol, Urine Negative NEG      Opiates, Urine Negative NEG      PCP, Urine Negative NEG      Cocaine, Urine Negative NEG      Amphetamine, Urine Negative NEG      Methadone, Urine Negative NEG      Comments: (NOTE)    CBC with Auto Differential    Collection Time: 10/07/22  3:21 AM   Result Value Ref Range    WBC 14.5 (H) 4.6 - 13.2 K/uL    RBC 1.67 (L) 4.20 - 5.30 M/uL    Hemoglobin 6.0 (L) 12.0 - 16.0 g/dL    Hematocrit 18.3 (L) 35.0 - 45.0 %    MCV 109.6 (H) 78.0 - 100.0 FL    MCH 35.9 (H) 24.0 - 34.0 PG    MCHC 32.8 31.0 - 37.0 g/dL    RDW 16.1 (H) 11.6 - 14.5 %    Platelets 52 (L) 135 - 420 K/uL    MPV 11.4 9.2 - 11.8 FL    Nucleated RBCs 1.5 (H) 0 PER 100 WBC    nRBC 0.22 (H) 0.00 - 0.01 K/uL    Neutrophils % 84 (H) 40 - 73 %    Lymphocytes % 5 (L) 21 - 52 %    Monocytes % 10 3 - 10 %    Eosinophils % 0 0 - 5 %    Basophils % 0 0 - 2 %    Immature Granulocytes 1 (H) 0.0 - 0.5 %    Neutrophils Absolute 12.2 (H) 1.8 - 8.0 K/UL    Lymphocytes Absolute 0.7 (L) 0.9 - 3.6  K/UL    Monocytes Absolute 1.5 (H) 0.05 - 1.2 K/UL    Eosinophils Absolute 0.0 0.0 - 0.4 K/UL    Basophils Absolute 0.0 0.0 - 0.1 K/UL    Absolute Immature Granulocyte 0.1 (H) 0.00 - 0.04 K/UL    Differential Type AUTOMATED     Urinalysis, Micro    Collection Time: 10/07/22  3:21 AM   Result Value Ref Range    WBC, UA 0 to 3 0 - 4  /hpf    RBC, UA 0 to 3 0 - 5 /hpf    Epithelial Cells UA 1+ 0 - 5 /lpf    BACTERIA, URINE FEW (A) NEG /hpf   PREPARE RBC (CROSSMATCH), 4 Units    Collection Time: 10/07/22  5:00 AM   Result Value Ref Range    History Check Historical check performed    Ammonia    Collection Time: 10/07/22  7:00 AM   Result Value Ref Range    Ammonia 99 (H) 11 - 32 UMOL/L   Basic Metabolic Panel    Collection Time: 10/07/22  7:00 AM   Result Value Ref Range    Sodium 142 136 - 145 mmol/L    Potassium 4.9 3.5 - 5.5 mmol/L    Chloride 104 100 - 111 mmol/L    CO2 17 (L) 21 - 32 mmol/L    Anion Gap 21 (H) 3.0 - 18 mmol/L    Glucose 62 (L) 74 - 99 mg/dL    BUN 43 (H) 7.0 - 18 MG/DL    Creatinine 2.79 (H) 0.6 - 1.3 MG/DL    Bun/Cre Ratio 15 12 - 20      Est, Glom Filt Rate 17 (L) >60 ml/min/1.24m    Calcium 7.7 (L) 8.5 - 10.1 MG/DL   Magnesium    Collection Time: 10/07/22  7:00 AM   Result Value Ref Range    Magnesium 1.8 1.6 - 2.6 mg/dL   Phosphorus    Collection Time: 10/07/22  7:00 AM   Result Value Ref Range    Phosphorus 6.4 (H) 2.5 - 4.9 MG/DL   CBC with Auto Differential    Collection Time: 10/07/22  7:00 AM   Result Value Ref Range    WBC 13.6 (H) 4.6 - 13.2 K/uL    RBC 2.00 (L) 4.20 - 5.30 M/uL    Hemoglobin 7.2 (L) 12.0 - 16.0 g/dL    Hematocrit 21.4 (L) 35.0 - 45.0 %    MCV 107.0 (H) 78.0 - 100.0 FL    MCH 36.0 (H) 24.0 - 34.0 PG    MCHC 33.6 31.0 - 37.0 g/dL    RDW 16.2 (H) 11.6 - 14.5 %    Platelets 72 (L) 135 - 420 K/uL    MPV 12.1 (H) 9.2 - 11.8 FL    Nucleated RBCs 1.9 (H) 0 PER 100 WBC    nRBC 0.26 (H) 0.00 - 0.01 K/uL    Neutrophils % 86 (H) 40 - 73 %    Lymphocytes % 5 (L) 21 - 52 %    Monocytes % 8 3 - 10 %    Eosinophils % 0 0 - 5 %    Basophils % 0 0 - 2 %    Immature Granulocytes 1 (H) 0.0 - 0.5 %    Neutrophils Absolute 11.7 (H) 1.8 - 8.0 K/UL    Lymphocytes Absolute 0.7 (L) 0.9 - 3.6 K/UL    Monocytes Absolute 1.1 0.05 - 1.2 K/UL    Eosinophils  Absolute 0.0 0.0 - 0.4 K/UL    Basophils Absolute 0.0 0.0 - 0.1 K/UL     Absolute Immature Granulocyte 0.1 (H) 0.00 - 0.04 K/UL    Differential Type AUTOMATED     Procalcitonin    Collection Time: 10/07/22  7:00 AM   Result Value Ref Range    Procalcitonin 5.14 ng/mL   Lactic Acid    Collection Time: 10/07/22  8:20 AM   Result Value Ref Range    Lactic Acid, Plasma 16.8 (HH) 0.4 - 2.0 MMOL/L   POCT Glucose    Collection Time: 10/07/22 10:47 AM   Result Value Ref Range    POC Glucose 46 (LL) 70 - 110 mg/dL   POCT Glucose    Collection Time: 10/07/22 11:24 AM   Result Value Ref Range    POC Glucose 55 (L) 70 - 110 mg/dL   Cell Count with Differential, Body Fluid    Collection Time: 10/07/22 12:05 PM   Result Value Ref Range    Specimen ASCITIC FLUID      Color, Fluid YELLOW     Appearance, Fluid CLOUDY     RBC, Fluid <2,000 (A) NRRE /cu mm    Total Nucleated Cell Count 35 (A) NRRE /cu mm    Polys, Fluid 10 0 - 25 %    Bands, Fluid 0 %    Lymphocytes, Body Fluid 29 (A) NRRE %    Monocyte Count, Fluid 61 (A) NRRE %    Eos, Fluid 0 (A) NRRE %   Albumin, Body Fluid    Collection Time: 10/07/22 12:05 PM   Result Value Ref Range    Fluid Type ASCITIC FLUID      Albumin, Fluid <0.6 g/dL   Cytology, Non-Gyn    Collection Time: 10/07/22 12:05 PM   Result Value Ref Range    Cytology-Non Gyn        Specimen received in cytology. Pathology report generally follows within three business days, unless special studies (i.e. immunohistochemistry, molecular studies, etc) are required.   Protein, Body Fluid    Collection Time: 10/07/22 12:05 PM   Result Value Ref Range    Fluid Type ASCITIC FLUID      Total Protein, Body Fluid <2.0 g/dL   POCT Glucose    Collection Time: 10/07/22 12:11 PM   Result Value Ref Range    POC Glucose 279 (H) 70 - 110 mg/dL   Lactic Acid    Collection Time: 10/07/22  1:02 PM   Result Value Ref Range    Lactic Acid, Plasma 15.1 (HH) 0.4 - 2.0 MMOL/L   POCT Blood Gas & Electrolytes    Collection Time: 10/07/22  1:18 PM   Result Value Ref Range    POC Ionized Calcium 0.95 (L) 1.12  - 1.32 mmol/L    Base Deficit (POC) 12.6 mmol/L    POC HCO3 15.5 (L) 22 - 26 MMOL/L    POC TCO2 16 (L) 19 - 24 MMOL/L    POC O2 SAT 53 %    Source VENOUS BLOOD      Site LEFT RADIAL      Allen Test Positive      DEVICE ADULT VENT      Mode Volume Control      FIO2 Arterial 100 %    POC TIDAL VOLUME 350      Respiratory Rate 16      POC PEEP/CPA 6      Performed by: Bobette Mo  POC Sodium 136 136 - 145 mmol/L    POC Potassium 4.9 3.5 - 5.1 mmol/L    POC Glucose 348 (H) 65 - 100 mg/dL    POC Creatinine 2.55 (H) 0.6 - 1.3 mg/dL    POC Lactic Acid >18.00 (HH) 0.40 - 2.00 mmol/L    POC Chloride 99 98 - 107 mmol/L    Anion Gap, POC Cannot be calculated  Cannot be calculated   10 - 20 mmol/L    PH, VENOUS (POC) 7.13 (LL) 7.32 - 7.42      PCO2, Venus, POC 46.2 41 - 51 MMHG    PO2, VENOUS (POC) 37 25 - 40 mmHg    eGFR, POC 19 (L) >60 ml/min/1.20m   PREPARE RBC (CROSSMATCH), 1 Units    Collection Time: 10/07/22  4:45 PM   Result Value Ref Range    History Check Historical check performed    PREPARE PLATELETS, 1 Product    Collection Time: 10/07/22  4:45 PM   Result Value Ref Range    Unit Number [id number]    Product Code Blood Bank PLP,LR PSTC2     Unit Divison 00     Dispense Status Blood Bank ISSUED     Unit Issue Date/Time [id number]    Product Code Blood Bank [id number]    Blood Bank Unit Type and Rh O POS     Blood Bank ISBT Product Blood Type 5100     Blood Bank Blood Product Expiration Date 2UJ:1656327   PREPARE PLASMA, 1 Units    Collection Time: 10/07/22  4:45 PM   Result Value Ref Range    CALLED TO [facility] ON 10/07/22 AT 1730 BY RPB     Unit Number [id number]    Product Code Blood Bank FP 24h,Thaw     Unit Divison 00     Dispense Status Blood Bank ISSUED     Unit Issue Date/Time 0JG:4281962    Product Code Blood Bank [id number]    Blood Bank Unit Type and Rh O POS     Blood Bank ISBT Product Blood Type 5100     Blood Bank Blood Product Expiration Date 2FE:4259277   Lactic Acid    Collection  Time: 10/07/22  5:40 PM   Result Value Ref Range    Lactic Acid, Plasma 13.4 (HH) 0.4 - 2.0 MMOL/L   Hepatitis B Surface Antigen    Collection Time: 10/07/22  5:40 PM   Result Value Ref Range    Hepatitis B Surface Ag <0.10 <1.00 Index    Hep B S Ag Interp Negative NEG     Fibrinogen    Collection Time: 10/07/22  5:40 PM   Result Value Ref Range    Fibrinogen 60 (LL) 210 - 451 mg/dL   Protime-INR    Collection Time: 10/07/22  5:40 PM   Result Value Ref Range    Protime >75.0 (H) 11.9 - 14.7 sec    INR >9.2 (HH) 0.9 - 1.1   CBC with Auto Differential    Collection Time: 10/07/22  5:40 PM   Result Value Ref Range    WBC 10.4 4.6 - 13.2 K/uL    RBC 0.92 (L) 4.20 - 5.30 M/uL    Hemoglobin 3.3 (LL) 12.0 - 16.0 g/dL    Hematocrit 9.8 (LL) 35.0 - 45.0 %    MCV 106.5 (H) 78.0 - 100.0 FL    MCH 35.9 (H) 24.0 - 34.0 PG  MCHC 33.7 31.0 - 37.0 g/dL    RDW 16.2 (H) 11.6 - 14.5 %    Platelets 32 (L) 135 - 420 K/uL    MPV 11.9 (H) 9.2 - 11.8 FL    Nucleated RBCs 2.6 (H) 0 PER 100 WBC    nRBC 0.27 (H) 0.00 - 0.01 K/uL    Neutrophils % 86 (H) 40 - 73 %    Band Neutrophils 4 %    Lymphocytes % 8 (L) 21 - 52 %    Monocytes % 2 (L) 3 - 10 %    Eosinophils % 0 0 - 5 %    Basophils % 0 0 - 2 %    Immature Granulocytes 0 0.0 - 0.5 %    Neutrophils Absolute 9.4 (H) 1.8 - 8.0 K/UL    Lymphocytes Absolute 0.8 (L) 0.9 - 3.6 K/UL    Monocytes Absolute 0.2 0.05 - 1.2 K/UL    Eosinophils Absolute 0.0 0.0 - 0.4 K/UL    Basophils Absolute 0.0 0.0 - 0.1 K/UL    Absolute Immature Granulocyte 0.0 0.00 - 0.04 K/UL    Differential Type AUTOMATED      Platelet Comment DECREASED PLATELETS      RBC Comment ANISOCYTOSIS  1+        RBC Comment MACROCYTOSIS  1+        RBC Comment POLYCHROMASIA  1+       Basic Metabolic Panel    Collection Time: 10/07/22  5:40 PM   Result Value Ref Range    Sodium 136 136 - 145 mmol/L    Potassium 4.8 3.5 - 5.5 mmol/L    Chloride 101 100 - 111 mmol/L    CO2 19 (L) 21 - 32 mmol/L    Anion Gap 16 3.0 - 18 mmol/L    Glucose  369 (H) 74 - 99 mg/dL    BUN 45 (H) 7.0 - 18 MG/DL    Creatinine 2.83 (H) 0.6 - 1.3 MG/DL    Bun/Cre Ratio 16 12 - 20      Est, Glom Filt Rate 17 (L) >60 ml/min/1.30m    Calcium 6.2 (L) 8.5 - 10.1 MG/DL   Magnesium    Collection Time: 10/07/22  5:40 PM   Result Value Ref Range    Magnesium 1.7 1.6 - 2.6 mg/dL   Phosphorus    Collection Time: 10/07/22  5:40 PM   Result Value Ref Range    Phosphorus 6.2 (H) 2.5 - 4.9 MG/DL   PREPARE PLASMA, 2 Units    Collection Time: 10/07/22  6:45 PM   Result Value Ref Range    Unit Number [id number]    Product Code Blood Bank FP 24h,Thaw1     Unit Divison 00     Dispense Status Blood Bank ISSUED     Unit Issue Date/Time 0XA:478525    Product Code Blood Bank [id number]     Blood Bank Unit Type and Rh O POS     Blood Bank ISBT Product Blood Type 5100     Blood Bank Blood Product Expiration Date 2FE:4259277    Unit Number [id number]    Product Code Blood Bank FP 24h,Thaw2     Unit Divison 00     Dispense Status Blood Bank ISSUED     Unit Issue Date/Time 0XA:478525    Product Code Blood Bank [id number]:4747623    Blood Bank Unit Type and Rh O POS     Blood Bank  ISBT Product Blood Type 5100     Blood Bank Blood Product Expiration Date BQ:7287895    PREPARE CRYOPRECIPITATE, 1 Product    Collection Time: 10/07/22  6:45 PM   Result Value Ref Range    CALLED TO [facility] ON 10/07/22 AT 1932 BY RPB     Unit Number [id number]     Product Code Blood Bank CRYO,5U Thaw     Unit Divison 00     Dispense Status Blood Bank ISSUED     Unit Issue Date/Time [id number]     Product Code Blood Bank E3591V00     Blood Bank Unit Type and Rh O POS     Blood Bank ISBT Product Blood Type 5100     Blood Bank Blood Product Expiration Date 202403120122    PREPARE RBC (CROSSMATCH), 3 Units    Collection Time: 10/07/22  7:00 PM   Result Value Ref Range    History Check Historical check performed    POC CG4:BLOOD GAS,LACTATE    Collection Time: 10/07/22  7:13 PM   Result Value Ref Range    DEVICE  ADULT VENT      FIO2 100 %    POC pH 7.09 (LL) 7.35 - 7.45      POC pCO2 37.1 35.0 - 45.0 MMHG    POC PO2 264 (H) 80 - 100 MMHG    POC HCO3 11.2 (L) 22 - 26 MMOL/L    POC O2 SAT 99.7 (H) 92 - 97 %    Base Deficit (POC) 16.7 mmol/L    Mode ASSIST CONTROL      POC TIDAL VOLUME 350 ml    POC Allen's Test NOT APPLICABLE      Site DRAWN FROM ARTERIAL LINE      Pt Temp 98      Specimen type: ARTERIAL      Performed by: Rosana Fret     POC Lactic Acid 16.51 (HH) 0.40 - 2.00 mmol/L   Renal Function Panel    Collection Time: 10/07/22  9:05 PM   Result Value Ref Range    Sodium 142 136 - 145 mmol/L    Potassium 4.4 3.5 - 5.5 mmol/L    Chloride 103 100 - 111 mmol/L    CO2 10 (L) 21 - 32 mmol/L    Anion Gap 29 (H) 3.0 - 18 mmol/L    Glucose 233 (H) 74 - 99 mg/dL    BUN 38 (H) 7.0 - 18 MG/DL    Creatinine 2.58 (H) 0.6 - 1.3 MG/DL    Bun/Cre Ratio 15 12 - 20      Est, Glom Filt Rate 19 (L) >60 ml/min/1.40m    Calcium 7.5 (L) 8.5 - 10.1 MG/DL    Phosphorus PENDING MG/DL    Albumin 1.9 (L) 3.4 - 5.0 g/dL   CBC with Auto Differential    Collection Time: 10/07/22  9:05 PM   Result Value Ref Range    WBC 7.6 4.6 - 13.2 K/uL    RBC 2.42 (L) 4.20 - 5.30 M/uL    Hemoglobin 7.4 (L) 12.0 - 16.0 g/dL    Hematocrit 24.3 (L) 35.0 - 45.0 %    MCV 100.4 (H) 78.0 - 100.0 FL    MCH 30.6 24.0 - 34.0 PG    MCHC 30.5 (L) 31.0 - 37.0 g/dL    RDW 14.1 11.6 - 14.5 %    Platelets 28 (LL) 135 - 420 K/uL    MPV 10.3 9.2 - 11.8  FL    Nucleated RBCs 4.1 (H) 0 PER 100 WBC    nRBC 0.31 (H) 0.00 - 0.01 K/uL    Neutrophils % 62 40 - 73 %    Lymphocytes % 19 (L) 21 - 52 %    Monocytes % 15 (H) 3 - 10 %    Eosinophils % 0 0 - 5 %    Basophils % 0 0 - 2 %    Immature Granulocytes 4 (H) 0.0 - 0.5 %    Neutrophils Absolute 4.7 1.8 - 8.0 K/UL    Lymphocytes Absolute 1.4 0.9 - 3.6 K/UL    Monocytes Absolute 1.1 0.05 - 1.2 K/UL    Eosinophils Absolute 0.0 0.0 - 0.4 K/UL    Basophils Absolute 0.0 0.0 - 0.1 K/UL    Absolute Immature Granulocyte 0.3 (H) 0.00 - 0.04  K/UL    Differential Type AUTOMATED         CT Result (most recent):  CT CHEST WO CONTRAST 10/07/2022    Narrative  Noncontrast CT of the chest.    CLINICAL INDICATION/HISTORY: Aspiration pneumonia in the setting of hematemesis.  ADD demonstrate mucosal tear proximally at the upper esophageal sphincter in the  posterior wall. Clot in the stomach.    TECHNIQUE: MDCT of the chest obtained. Sagittal and coronal reformations then  created with the original data set. All CT scans at this facility are performed  using dose optimization techniques as appropriate to a performed exam, to  include automated exposure control, adjustment of the mA and/or kV according to  patient's size (including appropriate matching for site specific examinations),  or use of iterative reconstruction technique.    COMPARISON: No priors on PACS    FINDINGS:    Base of neck: High density appearance left base of neck suggesting hematoma. See  CT neck discussion.    Lungs: Multifocal consolidation, both lower lobes at basilar segments greater  medially, posteriorly right middle lobe, posteriorly at right upper lobe.  Additional fuzzy nodules scattered elsewhere in the lungs.    Airways: Endotracheal tube in good position ending above the carina. Debris in  the trachea, right main stem bronchus. No bronchiectasis.    Pleural space: No effusions.    Heart and pericardium: Normal size heart. No significant pericardial fluid.    Esophagus: Air in the esophagus. A few dots of air at the base of the neck that  may be extraluminal (6, 8). Study limited by lack of intravenous contrast.    Great vessels and mediastinum: Arteriosclerosis aortic arch. Right IJ central  line has its tip near the caval atrial junction.    Lymph nodes: No adenopathy.    Base of neck: Included thyroid unremarkable.    Upper abdomen:  Attenuation fluid is present in the stomach averaging 14-60 Hounsfield units.  Ascites.    MSK/body wall: Unremarkable for  age.    Impression  Multi lobar lung opacities. Most of these are dependent and may be due to  aspiration.    Few dots of air at the neck adjacent to cervical esophagus not clearly  intraluminal and might be related to the esophageal perforation.    High attenuation fluid in the stomach consistent with clot seen on EGD.    Hematoma right base of neck. See discussion on CTA neck.    Ascites fluid.     CT SOFT TISSUE NECK W CONTRAST  Order: [id number]  Status: Final result       Visible to patient:  Yes (not seen)       Next appt: 03/24/2023 at 01:20 PM in Family Medicine Dossie Der, DO)    0 Result Notes  Details    Reading Physician Reading Date Result Priority   Nolene Ebbs, MD  [phone] 10/07/2022      Narrative & Impression  Exam: Neck CT with contrast     Clinical indication/history: Esophageal perforation. Question fluid collection.     Comparison: No priors on PACS     Technique:  After the intravenous administration of iodinated IV contrast, MDCT  was obtained from low head down to upper chest.  Sagittal and coronal  reformations were then created from the original data set. All CT scans at this  facility are performed using dose optimization techniques as appropriate to a  performed exam, to include automated exposure control, adjustment of the mA  and/or kV according to patient's size (including appropriate matching for site  specific examinations), or use of iterative reconstruction technique. ii     Findings:     Base of brain: Base of the brain is unremarkable.      Orbits: Orbits unremarkable. Globes symmetric.     Paranasal sinuses: There are fluid levels in both maxillary sinuses, both  sphenoid sinuses and multiple opacified ethmoid air cells.     Parapharyngeal space: Fluid in the pharynx. There is low-density retropharyngeal  fluid from C2 through C3/C4, greater to the right of midline.     Salivary glands: Salivary glands are unremarkable.     Vascular structures: At the left  neck, unremarkable.     At the right neck, there is collection of ill-defined active extravasation of  contrast into the supraclavicular region that appears to arise from proximal  right subclavian artery (series 12 image 54). This is just posterior to the  internal jugular vein. No definite extravasation of contrast is seen from the  vein which is mildly compressed by the hematoma. Enhancing hematoma measures 3 x  2 cm.     Lymph nodes: No adenopathy     Thyroid: Unremarkable     Esophagus: There are a few dots of gas at base of neck at level of cervical  esophagus (51) and to the left of the upper thoracic esophagus (61) compatible  with history of perforation. Gas-filled collection measures about 16 mm.  Surrounding edema in the mediastinum. There is also high attenuation fluid in  the right side mediastinum consistent with blood products.     Superior chest: Multifocal consolidation. See chest CT     MSK/other: Advanced degenerative changes cervical spine.      IMPRESSION:  :     Active bleeding is occurring at right base of neck. It appears to arise from the  right subclavian artery. Ill-defined collection more likely hematoma than  pseudoaneurysm.  -Also hematoma in the superior mediastinum inferior to this active hemorrhage  that may be related to previous or intermittent hemorrhage.  -Recommend consultation with vascular surgery.     Extraluminal air is seen around the cervical and upper thoracic esophagus  compatible with history of perforation seen on endoscopy.     Retropharyngeal fluid is low density but probably related to the bleeding vessel  and less likely to the perforation as it is above the level of the cervical  esophagus and also no gas.     Fluid-filled paranasal sinuses.     D/w Dr. Verdon Cummins.           CTA  ABDOMEN PELVIS W WO CONTRAST  Order: [id number]  Status: Final result       Visible to patient: Yes (not seen)       Next appt: 03/24/2023 at 01:20 PM in Family Medicine Dossie Der, DO)    0 Result Notes  Details    Reading Physician Reading Date Result Priority   Nolene Ebbs, MD  [phone] 10/07/2022      Narrative & Impression  CT Angiogram Of The Abdomen And Pelvis     HISTORY: Clot in the stomach on recent EGD.Marland Kitchen     TECHNIQUE: Multidetector helical CT angiography was carried out from low chest  to the initial tuberosities crests prior to and following intravenous contrast  administration during arterial and venous phases.  Thin section image  collimation was utilized and 3-D images were postprocessed on an International aid/development worker.  Parenchymal organ imaging will be limited by arterial  phase of contrast administration. All CT scans at this facility are performed  using dose optimization techniques as appropriate to a performed exam, to  include automated exposure control, adjustment of the mA and/or kV according to  patient's size (including appropriate matching for site specific examinations),  or use of iterative reconstruction technique. i     COMPARISON: Chest CT same day.     FINDINGS:     CT Abdomen and pelvis:     Lung bases: Please see discussion from the chest CT same day. Small effusions at  the lung bases better seen than on the noncontrast chest CT.     Liver: Small with a nodular contour. No hypervascular lesions. Heterogeneous  enhancement. Recanalized umbilical vein. No focal lesions or duct dilatation.      Gallbladder/biliary: Cholecystectomy. No duct dilatation.     Spleen: Normal size and enhancement. Collateral vessels around the spleen.     Pancreas: Normal enhancement. No duct dilatation.      Adrenals: Intensely enhancing adrenal glands.     Kidneys: Poorly enhancing small kidneys. No hydronephrosis.     Retroperitoneum: Flat IVC. Catheter in the right external iliac vein.     Lymph nodes: No adenopathy upper abdomen, mesentery, retroperitoneum, pelvis,  groins.      Bowel: High density material in the stomach compatible with clot. No  active  accumulation of contrast is seen during the scan. Edematous antrum. No  penetrating ulcer. Adjacent low-density fluid in the lesser sac. Duodenum  relatively collapsed. Small bowel also proximally relatively collapsed with  hyperemic mucosa. Ileum measures up to 2.8 cm. No bowel wall thickening.  Interloop fluid. No active accumulation of contrast is seen in the lumen of the  bowel during the time of the scan.     Edema in the wall of the right colon. Possible edema and the left colon and  rectosigmoid although collapsed and not well evaluated.     Peritoneal space: Large volume ascites fluid.     GU: Uterus not identified. Urinary bladder collapsed around Foley catheter  balloon.     Abdominal wall/MSK: Left indirect and probably right femoral hernias containing  fluid. Body wall edema. No acute abnormalities at skeleton. Lumbar stenosis and  decompression.        IMPRESSION:     No active hemorrhage is seen at the time of the scan.  -High density fluid in the stomach is consistent with clot seen at endoscopy.  -Edema at the antrum consistent with gastritis.     Hyperemic small bowel mucosa and  adrenal glands suggesting hypotension and  shock.     Unstable liver disease.  -Cirrhosis and stigmata of portal hypertension with collateral vessels and  marked ascites fluid.  -Colonic wall thickening at right colon and possibly left colon. Consider  hepatic colopathy.     Poorly enhancing kidneys consistent with renal failure.     Body wall edema.     D/w Dr. Verdon Cummins.     Results       Procedure Component Value Units Date/Time    Culture, Body Fluid [id number] Collected: 10/07/22 1205    Order Status: Sent Specimen: Pleural Fluid from Ascitic Fluid Updated: 10/07/22 1246    Culture, Respiratory [id number] Collected: 10/07/22 1140    Order Status: Sent Specimen: Bronchial Washing Updated: 10/07/22 1243    Blood Culture 1 [id number] Collected: 10/10/2022 2320    Order Status: Completed Specimen: Blood Updated:  10/07/22 0650     Special Requests NO SPECIAL REQUESTS        Culture NO GROWTH AFTER 6 HOURS       Blood Culture 2 [id number] Collected: 10/18/2022 2300    Order Status: Completed Specimen: Blood Updated: 10/07/22 0650     Special Requests NO SPECIAL REQUESTS        Culture NO GROWTH AFTER 6 HOURS               Latest Reference Range & Units 10/07/22 19:13   POC pH 7.35 - 7.45   7.09 (LL)   POC pCO2 35.0 - 45.0 MMHG 37.1   POC PO2 80 - 100 MMHG 264 (H)   POC HCO3 22 - 26 MMOL/L 11.2 (L)   POC O2 SAT 92 - 97 % 99.7 (H)   POC Allen's Test -   NOT APPLICABLE   POC TIDAL VOLUME ml 350   FIO2 % 100         Was the ME contacted? no  Was the family notified: yes   Name of notified:   Balinda Quails   [phone]   Caregiver     All children present whom made the decision to stop compressions       Discharge summary > 30 min   Atilano Ina, APRN - NP  10/07/22  Pulmonary, [facility]

## 2022-10-07 NOTE — Progress Notes (Signed)
CRRT   Orders   Mode:  CVVHDF   Blood Flow Rate:  200   Prismasol Dose:  ACD-A, 2 K+   Prismasol Concentrate:  ACD-A, 0 Ca, 3.5Ca   Blood Warmer Temp:  36.0   Net Fluid Removal:  0     Metrics   BP:  196/71   HR:  96   Access Pressure:  -40   Filter Pressure:  96   Return Pressure:  62   TMP:  68   Pressure Drop:  11     Access   Type & Location:  Right IJ trialysis   Comments:                                        Labs   HBsAg (Antigen) / date:   pending                                          HBsAb (Antibody) / date: pending   Source:      Safety:   Time Out Done:   (Time)  1743   Consent obtained/signed:  yes   Education:  yes   Primary Nurse Rpt Pre:  A. Horton, RN   Primary Nurse Rpt Post:  A. Horton, RN     Comments / Plan:    CRRT initiated w/o complications

## 2022-10-07 NOTE — Progress Notes (Signed)
Death Pronouncement Note    Patient lying in bed, mouth closed, eyes closed.  Pupils fixed and equal bilaterally.  No response to verbal or painful stimuli.  No heart or lung sounds auscultated.  No carotid or peripheral pulses.    Death officially pronounced at 11/05/29, 10/30/2022    Medical Examiner notified: No, does not meet criteria    Family Notified: YES      Forestine Na, AGACNP-BC  10-30-2022  Pulmonary, Critical Care Medicine  [facility]

## 2022-10-07 NOTE — ED Notes (Signed)
Blood ordered.   Patient A&Ox2 to person and place with intermittent confusion but per patient, she is a Restaurant manager, fast food.   Called niece listed on demographics. 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 MD spoke with niece and updated on patient status and low hemoglobin and possible complications. Informed her she needs to come to hospital with family and we need a consent for blood.   Blood currently on hold until consent or refusal is able to be obtained with family.

## 2022-10-07 NOTE — Progress Notes (Addendum)
Radiology reports reviewed.  Pt having multiple issues, now with right subclavian hematoma, which is in addition to her GI bleed and esophageal/pharyngeal perforation, in addition to coagulapathy, DIC, and decompensated cirrhosis and profound shock with refractory vasoplegia.  Pt too unstable for any surgical procedures including vascular surgery, given multiple sources of bleeding, pt needs correction of coagulopathy (which will be very unlikely in the setting of profound lactic acidosis, shock, and now cardiac arrest) prior to any consideration of surgical consultation or procedure.  Unable to tolerate CRRT which was also held due to cardiac arrest.   Pt has extremely poor/moribund prognosis.       Mellody Memos  MD/MPH     Pulmonary, Critical Care Medicine  [facility]

## 2022-10-07 NOTE — Progress Notes (Signed)
@  1530 arrived at patient bedside to initiate CRRT.  Per primary RN patient is to have a bedside endoscopy prior to CRRT initiation.       Primary RN will call HD suite once patient is ready to be put on CRRT.

## 2022-10-07 NOTE — Progress Notes (Addendum)
PCCM Update:    Patient initially evaluated in the ED with patient's daughter at bedside. Upon interview, pt seems confused and does not have capacity for medical decision making. Per patient's daughter, "do whatever needs to be done" and she stated pt is to receive all blood products that are required.    Addendum - Pt with worsening acidosis and oliguria. Case discussed with Dr. Prudence Davidson who agrees with CRRT. Patient's daughter and niece at bedside who consent for HD catheter and A line placement and as well CRRT.     Marcelline Mates, PA-C  10/07/22  Pulmonary, Critical Care Medicine  [facility]

## 2022-10-07 NOTE — ED Notes (Signed)
Pt is AxOx4. Pt has GCS of 15. She is accompanied by family at the bedside. Pt was hypothermic upon arrival. That has resolved with foley temp of 99.2. ICU PA at the bedside assessing pt. Repeat lactic just collected.

## 2022-10-07 NOTE — ED Notes (Signed)
Pt is on bed pan. Report called to ICU [facility].

## 2022-10-07 NOTE — ED Notes (Signed)
Patient placed on bedpan. Did not have BM.

## 2022-10-07 NOTE — Procedures (Signed)
[facility]   Abdominal Paracentesis Note (with Ultrasound)  Name:  Brianna Velazquez   DOB:  Aug 04, 1950   MRN:  [id number]   Date:  10/07/22        Procedure: US guided paracentesis - RIGHT .Marland Kitchen. lower quadrant of abdomen  Indication: Cirrhosis, tense ascites, concerns for SBP with variceal bleed  Labs: reviewed    Procedure performed emergently given worsening shock --- septic vs hemorrhagic  Prior to procedure, right lower quadrant examined with ultrasound and site marked.  Time out done with Nurse.    Sterile precautions were taken,  The site was cleaned with chloroprep and betadine times 3.  The site was prepped and draped in usual sterile fashion.   1% lidocaine anesthetic was administered locally utilizing ultrasound.  Peritoneal space entered utilizing centesis needle from paracentesis kit until pale yellow ascitic fluid yielded.  Catheter advance utilizing Seldinger technique over centesis needle.   Ascitic samples pulled for samples, totaling about 70m    Patient tolerated procedure well, breathing and O2 sats remained stable during the procedure.  No immediate complications, no bleeding or hematoma.       Due to technical limitations and emergent nature of procedure, [place]oreaimages were unable to be uploaded into EMR      HMellody Memos MD/MPH     Pulmonary, [facility]

## 2022-10-07 NOTE — Consults (Signed)
Interventional Radiology     Consult received from Dr. Posey Pronto for evaluation of upper GI bleed.     Case and images reviewed by Dr. Pershing Proud.     72 year old female with history of cirrhosis, portal HTN, and esophageal varices who was admitted for upper GI bleed 10/05/2022. Patient underwent upper endoscopy today 10/07/22 by Dr Truman Hayward. Impression of endoscopy was poor visualization due to large amount of clots and old blood within stomach and duodenum. Mucosal tear with false lumen seen proximally to upper esophageal sphincter in posterior wall.     Patient has MELD-NA score of 33. Not a TIPS candidate.  MELD 3.0: 35 at 10/07/2022  7:00 AM  MELD-Na: 33 at 10/07/2022  7:00 AM  Calculated from:  Serum Creatinine: 2.79 MG/DL at 10/07/2022  7:00 AM  Serum Sodium: 142 mmol/L (Using max of 137 mmol/L) at 10/07/2022  7:00 AM  Total Bilirubin: 3.6 MG/DL at 09/29/2022 10:19 PM  Serum Albumin: 1.9 g/dL at 10/13/2022 10:19 PM  INR(ratio): 3.0   at 10/25/2022 10:19 PM  Age at listing (hypothetical): 69 years  Sex: Female at 10/07/2022  7:00 AM    Would recommend CTA of abdomen to evaluate for arterial bleed.   If CTA positive then could evaluate for angiography and possible emobolization.  Please contact IR physician on call if CTA positive        Thank you,  Scherrie Gerlach, [place]  [phone]

## 2022-10-07 NOTE — Procedures (Signed)
[facility]  Pulmonary, Critical Care, and Sleep Medicine    Name: AUDRIANNE MONTIE MRN: [id number]   DOB: [birth year] Hospital: [facility]   Date: 10/07/2022        Bronchoscopy Report    Procedure: Diagnostic bronchoscopy.    Indication:  Hematemesis with aspiration into airway    Performed emergently with intubation due to aspiration of blood. Timeout verified the correct patient and correct procedure.     Anesthesia:   Patient on ventilator and receiving  Propofol drip       Procedure Details:   -- The bronchoscope was introduced through an endotracheal tube.   -- Throughout the airways there were extensive blood scattered.  These secretions were lavaged with multiple aliquots of saline to help aspiration as completely as possible.  Dark blood throughout all lobes consistent with aspiration of hematemesis.  -- The trachea and carina were completely inspected and were found to be normal.  -- The right-sided endobronchial anatomy was completely inspected and was found to be normal.  -- The left-sided endobronchial anatomy was completely inspected and was found to be normal.     Specimens:   The bronchoscope was wedged in the left lingula and bronchoalveolar lavage was performed; material was sent for  microbiology    Complications: none    Estimated Blood Loss: Minimal        Mellody Memos  MD/MPH     Pulmonary, Critical Care Medicine  [facility]

## 2022-10-07 NOTE — Care Coordination-Inpatient (Signed)
Writer spoke with family including Antonietta Barcelona; patients niece [phone]. Family says that patient lives with three family members and is normally able to perform her ADL's with difficulty.     Per family patient has a history of Mental health issues and is currently awaiting a liver transplant.     Patient currently does not have a MPOA however she does have 2 sons Her oldest son Audry Pili [phone] will be here this evening.     It is to soon to determine discharge planning as patient is currently ventilated and in critical condition.     Writer informed family that CM will keep them up to date on transition of care planning

## 2022-10-07 NOTE — ED Notes (Signed)
Report given to Justin, RN.

## 2022-10-07 NOTE — Plan of Care (Signed)
INTERVENTION:  HEMODYNAMIC STABILIZATION  MAINTAIN BP WNL WHILE ON CRRT.    INTERVENTION:  FLUID MANAGEMENT  WILL ATTEMPT 0 ML TOTAL FLUID REMOVAL.    INTERVENTION:  METABOLIC/ELECTROLYTE MANAGEMENT  (PRE BLD PUMP) SODIUM CITRATE (ACD-A), 2 POTASSIUM 0 CALCIUM (DIALYSATE) , 2 POTASSIUM 3.5 CALCIUM (REPLACEMENT) USED WITH CRRT TODAY.    INTERVENTION:  HEMODIALYSIS ACCESS SITE MANAGEMENT  RIGHT IJ TRIALYSIS ACCESSED USING ASEPTIC TECHNIQUE.    GOAL:  SIGNS AND SYMPTOMS OF LISTED POTENTIAL PROBLEMS WILL BE ABSENT OR MANAGEABLE.    OUTCOME:  PROGRESSING.    CRRT PLANNED FOR CONTINUOUS x72 HOURS.

## 2022-10-07 NOTE — ED Notes (Signed)
Patient has not provided urine sample yet.   Bladder scan completed with reading of >861m.   Dr. BQuay Burowmade aware and order to place foley.

## 2022-10-07 NOTE — Progress Notes (Addendum)
CRRT consent obtained by primary team   CRRT orders placed  [facility] citrate  Albumin gtt  Dialysis nurse aware     Please call with questions    Sabino Gasser, MD FASN  Cell [phone]  Pager: [phone]  Fax   [phone]

## 2022-10-07 NOTE — Consults (Signed)
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 Patient is Jehovah's Witness. Family consented for blood transfusion which is transfusing. Patient with known refractory ascites requiring therapeutic paracentesis every 1-2 weeks and on step 1 diuretics (lasix and spironolactone).  - Admit to ICU. Discussed with ICU team - Dr Mellody Memos and PA A Neita Carp. Patient to be assigned to room 2.  - EGD & sigmoidoscopy at bedside today. Allows for visualization/intervention of GI tract at indicated. Patient will need to be intubated and sedated.  - Tap water enema ordered  - Continue PPI and octreotide drip. Rocephin 1000 mg daily x 7 days. Emycin 500 mg IV ordered. Continue lactulose  - Blood transfusions and resuscitative measures per protocol  - Previously ordered CT abd/pelvis on hold to prioritize scopes.  - Therapeutic paracentesis as indicated. Ongoing TIPS candidacy as outpatient  - NPO  - Additional input after scopes  - [facility] new patient consultation scheduled 10/22/22.        Chief Complaint: GI bleed      HPI:  Brianna Velazquez is a 72 y.o. female who I am being asked to see in consultation for an opinion regarding GI bleed. She arrived to [facility] ED 10/08/2022 following episodes of coffee ground emesis mixed with fresh appearing blood and blood per rectum. Patient denies any alcohol intake. Admits compliance with outpatient step 1 diuretics and has not resumed alcohol intake. Last paracentesis 10/02/22 due to refractory ascites. She had IR evaluation for TIPS and was deemed a candidate. In ED, H/H finding of 6.0/18.3, lactic acid 19.8, glucose 20. She required critical care during initial assessment - vitamin K,  solu-cortef, PPI bolus/drip, levophed. Blood products administered after family consented as patient is Jehovah's Witness. She was also given lactulose with improvement mentation. CT abd/pelvis was ordered. She was hypothermic requiring Retail banker. Central line placed. Presently, she is able to answer simple questions. No recurrent hematemesis nor blood per rectum. Notes abdominal pain. Discussed plan for EGD and sigmoidoscopy and ICU admission. She has insight into present state and grants permission to proceed with appropriate interventional care. Family at bedside.      Principal Problem:    Upper GI bleed  Resolved Problems:    * No resolved hospital problems. *      PMH:   Past Medical History:   Diagnosis Date    Ascites     Asthma     Back pain     Cirrhosis of liver ([place])     Depression     Esophageal varices with bleeding in diseases classified elsewhere [facility])     GERD (gastroesophageal reflux disease)     Hepatic encephalopathy (HCC)     Hepatitis C     treated with Epclusa 08/2020 per liver specialist note 01/17/21 care everywhere    Hypertension     Hypokalemia     Hypomagnesemia     OSA (obstructive sleep apnea)     no cpap    Schizoaffective disorder (HCC)     Thrombocytopenia (HCC)     Venous insufficiency        PSH:   Past Surgical History:   Procedure Laterality Date    BACK SURGERY  part of hip removed right hip for back    COLONOSCOPY      ESOPHAGOGASTRODUODENOSCOPY      HYSTERECTOMY (CERVIX STATUS UNKNOWN)      KNEE ARTHROSCOPY Left     PARACENTESIS  10/02/2022    TUBAL LIGATION         Social HX:   Social History     Socioeconomic History    Marital status: Single   Tobacco Use    Smoking status: Former     Current packs/day: 0.25     Types: Cigarettes    Smokeless tobacco: Never    Tobacco comments:     Stopped 1996     Smoked 25 years   Vaping Use    Vaping Use: Never used   Substance and Sexual Activity    Alcohol use: Not Currently    Drug use: Never    Sexual activity: Not Currently      Social Determinants of Health     Financial Resource Strain: High Risk (02/21/2022)    Overall Financial Resource Strain (CARDIA)     Difficulty of Paying Living Expenses: Hard   Food Insecurity:   Recent Concern: Food Insecurity - Food Insecurity Present (02/21/2022)    Hunger Vital Sign     Worried About Running Out of Food in the Last Year: Sometimes true     Ran Out of Food in the Last Year: Sometimes true   Transportation Needs: Unmet Transportation Needs (02/21/2022)    PRAPARE - Transportation     Lack of Transportation (Non-Medical): Yes   Physical Activity: Inactive (03/21/2022)    Exercise Vital Sign     Days of Exercise per Week: 0 days     Minutes of Exercise per Session: 0 min   Housing Stability: Unknown (02/21/2022)    Housing Stability Vital Sign     Unstable Housing in the Last Year: No       FHX:   No family history on file.    Allergy:   Allergies   Allergen Reactions    Aspirin Anaphylaxis and Itching         Medications:       Current Facility-Administered Medications:     norepinephrine (LEVOPHED) 16 mg in sodium chloride 0.9 % 250 mL infusion, 1-100 mcg/min, IntraVENous, Continuous, Burns, Ellie L, MD, Last Rate: 23.4 mL/hr at 10/07/22 0641, 25 mcg/min at 10/07/22 0641    octreotide (SANDOSTATIN) 500 mcg in sodium chloride 0.9 % 100 mL infusion, 25 mcg/hr, IntraVENous, Continuous, Burns, Ellie L, MD    0.9 % sodium chloride infusion, , IntraVENous, PRN, Burns, Ellie L, MD    lactulose (CHRONULAC) 10 GM/15ML solution 20 g, 20 g, Oral, TID, Capilitan, Clarice Pole, APRN - NP    rifAXIMin (XIFAXAN) tablet 400 mg, 400 mg, Oral, TID, Capilitan, Clarice Pole, APRN - NP    cefTRIAXone (ROCEPHIN) 1,000 mg in sterile water 10 mL IV syringe, 1,000 mg, IntraVENous, Q24H, Burns, Ellie L, MD, 1,000 mg at 10/07/22 0105    pantoprazole (PROTONIX) 40 mg in sodium chloride 0.9 % 50 mL (mini-bag) infusion, 8 mg/hr, IntraVENous, Continuous, Burns, Ellie L, MD, Last Rate: 10 mL/hr at 10/07/22 0603, 8 mg/hr at 10/07/22  0603    vancomycin (VANCOCIN) intermittent dosing (placeholder), , Other, RX Placeholder, Burns, Lestine Box, MD    Current Outpatient Medications:     spironolactone (ALDACTONE) 50 MG tablet, Take 1 tablet by mouth daily, Disp: , Rfl:     fluticasone (FLONASE) 50 MCG/ACT  nasal spray, 1 spray by Nasal route daily as needed, Disp: , Rfl:     XIFAXAN 550 MG tablet, Take 1 tablet by mouth 2 times daily, Disp: 42 tablet, Rfl: 3    propranolol (INDERAL) 10 MG tablet, Take 1 tablet by mouth nightly, Disp: , Rfl:     pantoprazole (PROTONIX) 40 MG tablet, Take 1 tablet by mouth every morning, Disp: , Rfl:     montelukast (SINGULAIR) 10 MG tablet, Take 1 tablet by mouth as needed, Disp: , Rfl:     furosemide (LASIX) 40 MG tablet, Take 1 tablet by mouth daily, Disp: , Rfl:     QVAR REDIHALER 40 MCG/ACT AERB inhaler, as needed, Disp: , Rfl:     albuterol (ACCUNEB) 1.25 MG/3ML nebulizer solution, Inhale 3 mLs into the lungs as needed, Disp: , Rfl:     albuterol sulfate HFA (PROVENTIL;VENTOLIN;PROAIR) 108 (90 Base) MCG/ACT inhaler, USE 1 INHALATION EVERY 4 TO 6 HOURS AS NEEDED, Disp: , Rfl:          Review of Systems:     Review of Systems - Negative except as in HPI  History obtained from child, chart review, and the patient        BP (!) 118/54   Pulse (!) 117   Temp 99.4 F (37.4 C) (Bladder)   Resp 14   Ht 1.6 m ('5\' 3"'$ )   Wt 60.7 kg (133 lb 12.8 oz)   SpO2 100%   BMI 23.70 kg/m       Physical Assessment:   GENERAL ASSESSMENT: well developed, frail appearance, in no acute distress.   SKIN: lesions  HEAD: normocephalic  EYES: bulbous, mildly icteric  NOSE: normal external appearance and nares patent. O2 via NC  MOUTH: dry mucosa  NECK: normal  CHEST: respiratory effort normal with no retractions or accessory muscle use  HEART: tachycardia  ABDOMEN: distended; soft; mildly diffuse tenderness  ANAL: deferred/not performed  EXTREMITY: no deformity or contracture  NEURO: gross motor exam normal by observation  PSYC:  judgement/insight: appropriate. memory: appropriate. mood and affect: no evidence of depression, anxiety or agitation. orientation: oriented to time, space and person.       Recent Labs   Recent Results (from the past 24 hour(s))   CBC with Auto Differential    Collection Time: 10/25/2022 10:19 PM   Result Value Ref Range    WBC 16.9 (H) 4.6 - 13.2 K/uL    RBC 2.41 (L) 4.20 - 5.30 M/uL    Hemoglobin 8.7 (L) 12.0 - 16.0 g/dL    Hematocrit 26.5 (L) 35.0 - 45.0 %    MCV 110.0 (H) 78.0 - 100.0 FL    MCH 36.1 (H) 24.0 - 34.0 PG    MCHC 32.8 31.0 - 37.0 g/dL    RDW 15.9 (H) 11.6 - 14.5 %    Platelets 63 (L) 135 - 420 K/uL    MPV 11.9 (H) 9.2 - 11.8 FL    Nucleated RBCs 2.0 (H) 0 PER 100 WBC    nRBC 0.34 (H) 0.00 - 0.01 K/uL    Neutrophils % 88 (H) 40 - 73 %    Lymphocytes % 4 (L) 21 - 52 %    Monocytes % 7 3 - 10 %    Eosinophils % 0 0 - 5 %    Basophils % 0 0 - 2 %    Immature Granulocytes 1 (H) 0.0 - 0.5 %    Neutrophils Absolute 14.9 (H) 1.8 - 8.0  K/UL    Lymphocytes Absolute 0.7 (L) 0.9 - 3.6 K/UL    Monocytes Absolute 1.1 0.05 - 1.2 K/UL    Eosinophils Absolute 0.0 0.0 - 0.4 K/UL    Basophils Absolute 0.0 0.0 - 0.1 K/UL    Absolute Immature Granulocyte 0.2 (H) 0.00 - 0.04 K/UL    Differential Type AUTOMATED     CMP    Collection Time: 10/05/2022 10:19 PM   Result Value Ref Range    Sodium 144 136 - 145 mmol/L    Potassium 4.0 3.5 - 5.5 mmol/L    Chloride 100 100 - 111 mmol/L    CO2 18 (L) 21 - 32 mmol/L    Anion Gap 26 (H) 3.0 - 18 mmol/L    Glucose 39 (LL) 74 - 99 mg/dL    BUN 41 (H) 7.0 - 18 MG/DL    Creatinine 2.83 (H) 0.6 - 1.3 MG/DL    Bun/Cre Ratio 14 12 - 20      Est, Glom Filt Rate 17 (L) >60 ml/min/1.61m    Calcium 8.8 8.5 - 10.1 MG/DL    Total Bilirubin 3.6 (H) 0.2 - 1.0 MG/DL    ALT 53 13 - 56 U/L    AST 117 (H) 10 - 38 U/L    Alk Phosphatase 108 45 - 117 U/L    Total Protein 5.8 (L) 6.4 - 8.2 g/dL    Albumin 1.9 (L) 3.4 - 5.0 g/dL    Globulin 3.9 2.0 - 4.0 g/dL    Albumin/Globulin Ratio 0.5 (L) 0.8 - 1.7      ETOH    Collection Time: 10/11/2022 10:19 PM   Result Value Ref Range    Ethanol Lvl <3 0 - 3 MG/DL   Lipase    Collection Time: 10/17/2022 10:19 PM   Result Value Ref Range    Lipase 35 13 - 75 U/L   TYPE AND SCREEN    Collection Time: 10/26/2022 10:19 PM   Result Value Ref Range    Crossmatch expiration date 10/09/2022,2359     ABO/Rh O POSITIVE     Antibody Screen NEG     Unit Number [id number]    Product Code Blood Bank RC LR     Unit Divison 00     Dispense Status Blood Bank ALLOCATED     Crossmatch Result Compatible     Unit Number [id number]    Product Code Blood Bank RC LR     Unit Divison 00     Dispense Status Blood Bank ISSUED     Unit Issue Date/Time [id number]    Product Code Blood Bank [id number]    Blood Bank Unit Type and Rh O POS     Blood Bank ISBT Product Blood Type 5100     Blood Bank Blood Product Expiration Date [id number]:466891    Crossmatch Result Compatible    Protime-INR    Collection Time: 09/27/2022 10:19 PM   Result Value Ref Range    Protime 31.7 (H) 11.9 - 14.7 sec    INR 3.0 (H) 0.9 - 1.1     APTT    Collection Time: 10/02/2022 10:19 PM   Result Value Ref Range    APTT 40.8 (H) 23.0 - 36.4 SEC   TSH    Collection Time: 10/10/2022 10:19 PM   Result Value Ref Range    TSH, 3RD GENERATION 3.80 (H) 0.36 - 3.74 uIU/mL   T4, Free    Collection Time: 10/10/2022  10:19 PM   Result Value Ref Range    T4 Free 1.3 0.7 - 1.5 NG/DL   EKG 12 Lead    Collection Time: 09/27/2022 10:30 PM   Result Value Ref Range    Ventricular Rate 80 BPM    Atrial Rate 80 BPM    P-R Interval 114 ms    QRS Duration 72 ms    Q-T Interval 450 ms    QTc Calculation (Bazett) 519 ms    P Axis 63 degrees    R Axis 60 degrees    T Axis 36 degrees    Diagnosis       Sinus rhythm with premature supraventricular complexes  ST abnormality, possible digitalis effect  Prolonged QT  Abnormal ECG  No previous ECGs available     POCT Glucose    Collection Time: 10/09/2022 10:49 PM   Result Value Ref Range    POC Glucose <20.0 (LL) 70 - 110  mg/dL   POCT Glucose    Collection Time: 10/21/2022 10:56 PM   Result Value Ref Range    POC Glucose 43 (LL) 70 - 110 mg/dL   Blood Culture 2    Collection Time: 10/24/2022 11:00 PM    Specimen: Blood   Result Value Ref Range    Special Requests NO SPECIAL REQUESTS      Culture NO GROWTH AFTER 6 HOURS     Blood Culture 1    Collection Time: 09/30/2022 11:20 PM    Specimen: Blood   Result Value Ref Range    Special Requests NO SPECIAL REQUESTS      Culture NO GROWTH AFTER 6 HOURS     POCT Glucose    Collection Time: 10/26/2022 11:26 PM   Result Value Ref Range    POC Glucose 20 (LL) 70 - 110 mg/dL   Lactic Acid    Collection Time: 10/20/2022 11:30 PM   Result Value Ref Range    Lactic Acid, Plasma 19.8 (HH) 0.4 - 2.0 MMOL/L   POCT Glucose    Collection Time: 10/07/22 12:26 AM   Result Value Ref Range    POC Glucose 151 (H) 70 - 110 mg/dL   POCT Glucose    Collection Time: 10/07/22 12:51 AM   Result Value Ref Range    POC Glucose 139 (H) 70 - 110 mg/dL   Ammonia    Collection Time: 10/07/22  1:21 AM   Result Value Ref Range    Ammonia 236 (H) 11 - 32 UMOL/L   Lactic Acid    Collection Time: 10/07/22  2:40 AM   Result Value Ref Range    Lactic Acid, Plasma 21.7 (HH) 0.4 - 2.0 MMOL/L   Urinalysis    Collection Time: 10/07/22  3:21 AM   Result Value Ref Range    Color, UA DARK YELLOW      Appearance CLOUDY      Specific Gravity, UA 1.017 1.005 - 1.030      pH, Urine 5.0 5.0 - 8.0      Protein, UA 30 (A) NEG mg/dL    Glucose, UA Negative NEG mg/dL    Ketones, Urine Negative NEG mg/dL    Bilirubin Urine SMALL (A) NEG      Blood, Urine SMALL (A) NEG      Urobilinogen, Urine 1.0 0.2 - 1.0 EU/dL    Nitrite, Urine Negative NEG      Leukocyte Esterase, Urine TRACE (A) NEG     Urine Drug Screen  Collection Time: 10/07/22  3:21 AM   Result Value Ref Range    Benzodiazepines, Urine Negative NEG      Barbiturates, Urine Negative NEG      THC, TH-Cannabinol, Urine Negative NEG      Opiates, Urine Negative NEG      PCP, Urine Negative NEG       Cocaine, Urine Negative NEG      Amphetamine, Urine Negative NEG      Methadone, Urine Negative NEG      Comments: (NOTE)    CBC with Auto Differential    Collection Time: 10/07/22  3:21 AM   Result Value Ref Range    WBC 14.5 (H) 4.6 - 13.2 K/uL    RBC 1.67 (L) 4.20 - 5.30 M/uL    Hemoglobin 6.0 (L) 12.0 - 16.0 g/dL    Hematocrit 18.3 (L) 35.0 - 45.0 %    MCV 109.6 (H) 78.0 - 100.0 FL    MCH 35.9 (H) 24.0 - 34.0 PG    MCHC 32.8 31.0 - 37.0 g/dL    RDW 16.1 (H) 11.6 - 14.5 %    Platelets 52 (L) 135 - 420 K/uL    MPV 11.4 9.2 - 11.8 FL    Nucleated RBCs 1.5 (H) 0 PER 100 WBC    nRBC 0.22 (H) 0.00 - 0.01 K/uL    Neutrophils % 84 (H) 40 - 73 %    Lymphocytes % 5 (L) 21 - 52 %    Monocytes % 10 3 - 10 %    Eosinophils % 0 0 - 5 %    Basophils % 0 0 - 2 %    Immature Granulocytes 1 (H) 0.0 - 0.5 %    Neutrophils Absolute 12.2 (H) 1.8 - 8.0 K/UL    Lymphocytes Absolute 0.7 (L) 0.9 - 3.6 K/UL    Monocytes Absolute 1.5 (H) 0.05 - 1.2 K/UL    Eosinophils Absolute 0.0 0.0 - 0.4 K/UL    Basophils Absolute 0.0 0.0 - 0.1 K/UL    Absolute Immature Granulocyte 0.1 (H) 0.00 - 0.04 K/UL    Differential Type AUTOMATED     Urinalysis, Micro    Collection Time: 10/07/22  3:21 AM   Result Value Ref Range    WBC, UA 0 to 3 0 - 4 /hpf    RBC, UA 0 to 3 0 - 5 /hpf    Epithelial Cells UA 1+ 0 - 5 /lpf    BACTERIA, URINE FEW (A) NEG /hpf   PREPARE RBC (CROSSMATCH), 4 Units    Collection Time: 10/07/22  5:00 AM   Result Value Ref Range    History Check Historical check performed    Ammonia    Collection Time: 10/07/22  7:00 AM   Result Value Ref Range    Ammonia 99 (H) 11 - 32 UMOL/L   Basic Metabolic Panel    Collection Time: 10/07/22  7:00 AM   Result Value Ref Range    Sodium 142 136 - 145 mmol/L    Potassium 4.9 3.5 - 5.5 mmol/L    Chloride 104 100 - 111 mmol/L    CO2 17 (L) 21 - 32 mmol/L    Anion Gap 21 (H) 3.0 - 18 mmol/L    Glucose 62 (L) 74 - 99 mg/dL    BUN 43 (H) 7.0 - 18 MG/DL    Creatinine 2.79 (H) 0.6 - 1.3 MG/DL    Bun/Cre  Ratio 15 12 - 20  Est, Glom Filt Rate 17 (L) >60 ml/min/1.59m    Calcium 7.7 (L) 8.5 - 10.1 MG/DL   Magnesium    Collection Time: 10/07/22  7:00 AM   Result Value Ref Range    Magnesium 1.8 1.6 - 2.6 mg/dL   Phosphorus    Collection Time: 10/07/22  7:00 AM   Result Value Ref Range    Phosphorus 6.4 (H) 2.5 - 4.9 MG/DL   CBC with Auto Differential    Collection Time: 10/07/22  7:00 AM   Result Value Ref Range    WBC 13.6 (H) 4.6 - 13.2 K/uL    RBC 2.00 (L) 4.20 - 5.30 M/uL    Hemoglobin 7.2 (L) 12.0 - 16.0 g/dL    Hematocrit 21.4 (L) 35.0 - 45.0 %    MCV 107.0 (H) 78.0 - 100.0 FL    MCH 36.0 (H) 24.0 - 34.0 PG    MCHC 33.6 31.0 - 37.0 g/dL    RDW 16.2 (H) 11.6 - 14.5 %    Platelets 72 (L) 135 - 420 K/uL    MPV 12.1 (H) 9.2 - 11.8 FL    Nucleated RBCs 1.9 (H) 0 PER 100 WBC    nRBC 0.26 (H) 0.00 - 0.01 K/uL    Neutrophils % 86 (H) 40 - 73 %    Lymphocytes % 5 (L) 21 - 52 %    Monocytes % 8 3 - 10 %    Eosinophils % 0 0 - 5 %    Basophils % 0 0 - 2 %    Immature Granulocytes 1 (H) 0.0 - 0.5 %    Neutrophils Absolute 11.7 (H) 1.8 - 8.0 K/UL    Lymphocytes Absolute 0.7 (L) 0.9 - 3.6 K/UL    Monocytes Absolute 1.1 0.05 - 1.2 K/UL    Eosinophils Absolute 0.0 0.0 - 0.4 K/UL    Basophils Absolute 0.0 0.0 - 0.1 K/UL    Absolute Immature Granulocyte 0.1 (H) 0.00 - 0.04 K/UL    Differential Type AUTOMATED                  MICROBIOLOGY   Results       Procedure Component Value Units Date/Time    Blood Culture 1 [[id number]: 10/22/2022 2320    Order Status: Completed Specimen: Blood Updated: 10/07/22 0650     Special Requests NO SPECIAL REQUESTS        Culture NO GROWTH AFTER 6 HOURS       Blood Culture 2 [[id number]: 10/20/2022 2300    Order Status: Completed Specimen: Blood Updated: 10/07/22 0650     Special Requests NO SPECIAL REQUESTS        Culture NO GROWTH AFTER 6 HOURS                      Radiology    XR CHEST PORTABLE  Narrative: EXAM:  CHEST Portable 0202 hrs    CLINICAL HISTORY/INDICATION: CVL  placement     Additional: None        COMPARISON: 07/01/2022.    TECHNIQUE: Single AP view    FINDINGS:     LINES/TUBES/DEVICES: Right IJ approach central catheter terminates at the  midsuperior vena cava.    HEART AND MEDIASTINUM: No appreciable cardiomegaly. Remaining mediastinal  contours within normal limits.    LUNGS AND PLEURAL SPACES: Clear. No consolidation, mass, or effusion. No  pneumothorax.    BONY THORAX AND SOFT TISSUES: No significant findings.  Impression: No pneumothorax  Right IJ approach central catheter in satisfactory position.Elza Rafter, PA.   [facility]  Office: [phone]  Cell: [phone]  [email or web address]

## 2022-10-07 NOTE — Progress Notes (Signed)
PCCM update      Code blue was paged overhead and I was outside of room.  CPR performed using ACLS protocol.  I was leader of this CPR event    Initial rhythm: PEA  Code start [id number]      End result: ROSC  Code end time: 1827      Please refer to nursing flowsheets for additional details.  Resuscitation pre and post arrest including in attending attestation.        Mellody Memos  MD/MPH     Pulmonary, Critical Care Medicine  [facility]

## 2022-10-07 NOTE — Procedures (Signed)
[facility]  Temporary Hemodialysis Catheter Procedure Note With Ultrasound Guidance    Indication: Lactic acidosis, need for emergent CRRT    Risks, benefits, alternatives explained and consent obtained.    Time out performed.    Central line Bundle:  Full sterile barrier precautions used.  7-Step Sterility Protocol followed.  (cap, mask, sterile gown, sterile gloves, large sterile sheet, hand hygiene, 2% chlorhexidine for cutaneous antisepsis)  5 mL 1% Lidocaine placed at insertion site.      Using ultrasound guidance, right internal jugular vein cannulated x 1 attempt utilizing the modified Seldinger technique.    Position of the guidewire confirmed in vein using ultrasound prior to dilating.   Dilation completed successfully.   Guidewire was removed.  Central venous catheter was placed without difficulty.  No immediate complications encountered.  Good blood return on all 3 ports.  Catheter secured & sterile dressing applied.       CXR pending.        US imaging was used but unable to be uploaded into EMR due to technical limitations        Mellody Memos  MD/MPH     Pulmonary, [facility]

## 2022-10-07 NOTE — Progress Notes (Signed)
PCCM Update:     Code Blue Note:   2128/11/02- PEA, Epi x1   November 02, 2129- Family at bedside and wishes for no additional compressions. CPR stopped. Time of death: 11/02/29 October 27, 2022      Forestine Na, AGACNP-BC  10/27/22  Pulmonary, Critical Care Medicine  [facility]

## 2022-10-07 NOTE — ED Notes (Signed)
Patient placed on external catheter refusing straight cath for urine.

## 2022-10-07 NOTE — Procedures (Signed)
[facility]  [employer] Procedure Note With Ultrasound Guidance    Indication: Inadequate venous access    Procedure performed emergently in setting of worsening shock.Patient intubated and sedated prior to procedure.  Time out performed.  Patient positioned in Trendelenburg.     Central line Bundle:  Full sterile barrier precautions used.  7-Step Sterility Protocol followed.  (cap, mask sterile gown, sterile gloves, large sterile sheet, hand hygiene, 2% chlorhexidine for cutaneous antisepsis)      Using ultrasound guidance,   RIGHT/LEFT: right femoral vein cannulated x 1 attempt(s) utilizing the modified Seldinger technique.    Position of guidewire confirmed in vein using ultrasound prior to dilating.   Dilation completed once successfully.   Guidewire was removed.  Central venous catheter was placed without difficulty.  no immediate complications encountered.  Good blood return on all 3 ports.  Catheter secured & Biopatch applied.  Sterile Tegaderm placed.      Ultrasound imaging used but unable to upload into EMR due to technical limitations and emergent nature of procedure.      [employer], DO  [facility] PGY1  10/07/22        Attending Attestation:  I was present for and supervised the entire procedure.  See resident note for details.      Mellody Memos  MD/MPH     Pulmonary, Critical Care Medicine  [facility]

## 2022-10-07 NOTE — Progress Notes (Signed)
Chaplain completed the initial Spiritual Assessment of the patient, and offered Pastoral Care support to the patient in bed 4 of the emergency room . Patient is not alert at this time or able to communicate.  No family seen., There is no advance directive on file.  Patient does  not have any religious/cultural needs that will affect patient's preferences in health care. Chaplains will continue to follow and will provide pastoral care on an as needed/requested basis.    Gerlean Ren  Spiritual Care Department  [phone]

## 2022-10-08 LAB — PROTIME-INR
INR: 4 — ABNORMAL HIGH (ref 0.9–1.1)
Protime: 39.1 s — ABNORMAL HIGH (ref 11.9–14.7)

## 2022-10-08 LAB — CBC WITH AUTO DIFFERENTIAL
Band Neutrophils: 4 %
Basophils %: 0 % (ref 0–2)
Basophils %: 0 % (ref 0–2)
Basophils Absolute: 0 10*3/uL (ref 0.0–0.1)
Basophils Absolute: 0 10*3/uL (ref 0.0–0.1)
Eosinophils %: 0 % (ref 0–5)
Eosinophils %: 0 % (ref 0–5)
Eosinophils Absolute: 0 10*3/uL (ref 0.0–0.4)
Eosinophils Absolute: 0 10*3/uL (ref 0.0–0.4)
Hematocrit: 9.8 % — CL (ref 35.0–45.0)
Hematocrit: 24.3 % — ABNORMAL LOW (ref 35.0–45.0)
Hemoglobin: 3.3 g/dL — CL (ref 12.0–16.0)
Hemoglobin: 7.4 g/dL — ABNORMAL LOW (ref 12.0–16.0)
Immature Granulocytes %: 0 % (ref 0.0–0.5)
Immature Granulocytes %: 4 % — ABNORMAL HIGH (ref 0.0–0.5)
Immature Granulocytes Absolute: 0 10*3/uL (ref 0.00–0.04)
Immature Granulocytes Absolute: 0.3 10*3/uL — ABNORMAL HIGH (ref 0.00–0.04)
Lymphocytes %: 8 % — ABNORMAL LOW (ref 21–52)
Lymphocytes %: 19 % — ABNORMAL LOW (ref 21–52)
Lymphocytes Absolute: 0.8 10*3/uL — ABNORMAL LOW (ref 0.9–3.6)
Lymphocytes Absolute: 1.4 10*3/uL (ref 0.9–3.6)
MCH: 35.9 PG — ABNORMAL HIGH (ref 24.0–34.0)
MCH: 30.6 PG (ref 24.0–34.0)
MCHC: 33.7 g/dL (ref 31.0–37.0)
MCHC: 30.5 g/dL — ABNORMAL LOW (ref 31.0–37.0)
MCV: 106.5 FL — ABNORMAL HIGH (ref 78.0–100.0)
MCV: 100.4 FL — ABNORMAL HIGH (ref 78.0–100.0)
MPV: 11.9 FL — ABNORMAL HIGH (ref 9.2–11.8)
MPV: 10.3 FL (ref 9.2–11.8)
Monocytes %: 2 % — ABNORMAL LOW (ref 3–10)
Monocytes %: 15 % — ABNORMAL HIGH (ref 3–10)
Monocytes Absolute: 0.2 10*3/uL (ref 0.05–1.2)
Monocytes Absolute: 1.1 10*3/uL (ref 0.05–1.2)
Neutrophils %: 86 % — ABNORMAL HIGH (ref 40–73)
Neutrophils %: 62 % (ref 40–73)
Neutrophils Absolute: 9.4 10*3/uL — ABNORMAL HIGH (ref 1.8–8.0)
Neutrophils Absolute: 4.7 10*3/uL (ref 1.8–8.0)
Nucleated RBCs: 2.6 PER 100 WBC — ABNORMAL HIGH
Nucleated RBCs: 4.1 PER 100 WBC — ABNORMAL HIGH
Platelet Comment: DECREASED
Platelets: 32 10*3/uL — ABNORMAL LOW (ref 135–420)
Platelets: 28 10*3/uL — CL (ref 135–420)
RBC: 0.92 M/uL — ABNORMAL LOW (ref 4.20–5.30)
RBC: 2.42 M/uL — ABNORMAL LOW (ref 4.20–5.30)
RDW: 16.2 % — ABNORMAL HIGH (ref 11.6–14.5)
RDW: 14.1 % (ref 11.6–14.5)
WBC: 10.4 10*3/uL (ref 4.6–13.2)
WBC: 7.6 10*3/uL (ref 4.6–13.2)
nRBC: 0.27 10*3/uL — ABNORMAL HIGH (ref 0.00–0.01)
nRBC: 0.31 10*3/uL — ABNORMAL HIGH (ref 0.00–0.01)

## 2022-10-08 LAB — PREPARE PLASMA
Blood Bank Blood Product Expiration Date: 202403162359
Blood Bank Blood Product Expiration Date: 202403162359
Blood Bank Blood Product Expiration Date: 202403162359
Blood Bank ISBT Product Blood Type: 5100
Blood Bank ISBT Product Blood Type: 5100
Blood Bank ISBT Product Blood Type: 5100
Blood Bank Unit Type and Rh: O POS
Blood Bank Unit Type and Rh: O POS
Blood Bank Unit Type and Rh: O POS
Dispense Status Blood Bank: TRANSFUSED
Dispense Status Blood Bank: TRANSFUSED
Dispense Status Blood Bank: TRANSFUSED
Unit Divison: 0
Unit Divison: 0
Unit Divison: 0
Unit Issue Date/Time: 31120241751
Unit Issue Date/Time: 31120241938
Unit Issue Date/Time: 31120241938

## 2022-10-08 LAB — TYPE AND SCREEN
ABO/Rh: O POS
Antibody Screen: NEGATIVE
Blood Bank Blood Product Expiration Date: 202404072359
Blood Bank Blood Product Expiration Date: 202404082359
Blood Bank Blood Product Expiration Date: 202404082359
Blood Bank Blood Product Expiration Date: 202404082359
Blood Bank Blood Product Expiration Date: 202404082359
Blood Bank ISBT Product Blood Type: 5100
Blood Bank ISBT Product Blood Type: 5100
Blood Bank ISBT Product Blood Type: 5100
Blood Bank ISBT Product Blood Type: 5100
Blood Bank ISBT Product Blood Type: 5100
Blood Bank Unit Type and Rh: O POS
Blood Bank Unit Type and Rh: O POS
Blood Bank Unit Type and Rh: O POS
Blood Bank Unit Type and Rh: O POS
Blood Bank Unit Type and Rh: O POS
Dispense Status Blood Bank: TRANSFUSED
Dispense Status Blood Bank: TRANSFUSED
Dispense Status Blood Bank: TRANSFUSED
Dispense Status Blood Bank: TRANSFUSED
Dispense Status Blood Bank: TRANSFUSED
Unit Divison: 0
Unit Divison: 0
Unit Divison: 0
Unit Divison: 0
Unit Divison: 0
Unit Divison: 0
Unit Divison: 0
Unit Issue Date/Time: 31120240636
Unit Issue Date/Time: 31120241753
Unit Issue Date/Time: 31120242012
Unit Issue Date/Time: 31120242012
Unit Issue Date/Time: 31120242012

## 2022-10-08 LAB — RENAL FUNCTION PANEL
Albumin: 1.9 g/dL — ABNORMAL LOW (ref 3.4–5.0)
Anion Gap: 29 mmol/L — ABNORMAL HIGH (ref 3.0–18)
BUN/Creatinine Ratio: 15 (ref 12–20)
BUN: 38 MG/DL — ABNORMAL HIGH (ref 7.0–18)
CO2: 10 mmol/L — ABNORMAL LOW (ref 21–32)
Calcium: 7.5 MG/DL — ABNORMAL LOW (ref 8.5–10.1)
Chloride: 103 mmol/L (ref 100–111)
Creatinine: 2.58 MG/DL — ABNORMAL HIGH (ref 0.6–1.3)
Est, Glom Filt Rate: 19 mL/min/{1.73_m2} — ABNORMAL LOW (ref >60)
Glucose: 233 mg/dL — ABNORMAL HIGH (ref 74–99)
Phosphorus: 9.3 MG/DL — ABNORMAL HIGH (ref 2.5–4.9)
Potassium: 4.4 mmol/L (ref 3.5–5.5)
Sodium: 142 mmol/L (ref 136–145)

## 2022-10-08 LAB — POCT BLOOD GAS & ELECTROLYTES
Base Deficit (POC): 16.6 mmol/L
Base Deficit (POC): 9.9 mmol/L
FIO2 Arterial: 100 %
FIO2 Arterial: 100 %
Inspiratory Time: 0.86 s
POC Chloride: 104 mmol/L (ref 98–107)
POC Chloride: 98 mmol/L (ref 98–107)
POC Creatinine: 2.41 mg/dL — ABNORMAL HIGH (ref 0.6–1.3)
POC Creatinine: 3.43 mg/dL — ABNORMAL HIGH (ref 0.6–1.3)
POC Glucose: 265 mg/dL — ABNORMAL HIGH (ref 65–100)
POC Glucose: 320 mg/dL — ABNORMAL HIGH (ref 65–100)
POC HCO3: 11.2 MMOL/L — ABNORMAL LOW (ref 22–26)
POC HCO3: 15.5 MMOL/L — ABNORMAL LOW (ref 22–26)
POC Ionized Calcium: 0.85 mmol/L — ABNORMAL LOW (ref 1.12–1.32)
POC Ionized Calcium: 0.9 mmol/L — ABNORMAL LOW (ref 1.12–1.32)
POC Lactic Acid: 10.07 mmol/L (ref 0.40–2.00)
POC Lactic Acid: 16.39 mmol/L (ref 0.40–2.00)
POC O2 SAT: 100 %
POC O2 SAT: 100 %
POC PEEP/CPA: 5
POC PEEP/CPA: 6
POC PO2: 220 MMHG — ABNORMAL HIGH (ref 80–100)
POC PO2: 514 MMHG — ABNORMAL HIGH (ref 80–100)
POC Potassium: 3.7 mmol/L (ref 3.5–5.1)
POC Potassium: 4.6 mmol/L (ref 3.5–5.1)
POC Sodium: 135 mmol/L — ABNORMAL LOW (ref 136–145)
POC Sodium: 139 mmol/L (ref 136–145)
POC TCO2: 12 MMOL/L — ABNORMAL LOW (ref 19–24)
POC TCO2: 15 MMOL/L — ABNORMAL LOW (ref 19–24)
POC TIDAL VOLUME: 350
POC TIDAL VOLUME: 386
POC pCO2: 30.7 MMHG — ABNORMAL LOW (ref 35.0–45.0)
POC pCO2: 33.5 MMHG — ABNORMAL LOW (ref 35.0–45.0)
POC pH: 7.12 — CL (ref 7.35–7.45)
POC pH: 7.31 — ABNORMAL LOW (ref 7.35–7.45)
Pt Temp: 95
Respiratory Rate: 24
eGFR, POC: 14 mL/min/{1.73_m2} — ABNORMAL LOW (ref >60)
eGFR, POC: 21 mL/min/{1.73_m2} — ABNORMAL LOW (ref >60)

## 2022-10-08 LAB — PREPARE PLATELETS
Blood Bank Blood Product Expiration Date: 202403132359
Blood Bank ISBT Product Blood Type: 5100
Blood Bank Unit Type and Rh: O POS
Dispense Status Blood Bank: TRANSFUSED
Unit Divison: 0
Unit Issue Date/Time: 31120241741

## 2022-10-08 LAB — CULTURE, BODY FLUID: Culture: NO GROWTH

## 2022-10-08 LAB — FIBRINOGEN: Fibrinogen: 93 mg/dL — ABNORMAL LOW (ref 210–451)

## 2022-10-08 LAB — APTT

## 2022-10-08 LAB — PREPARE CRYOPRECIPITATE
Blood Bank Blood Product Expiration Date: 202403120122
Blood Bank ISBT Product Blood Type: 5100
Blood Bank Unit Type and Rh: O POS
Dispense Status Blood Bank: TRANSFUSED
Unit Divison: 0
Unit Issue Date/Time: 31120241942

## 2022-10-08 LAB — HEPATITIS B SURFACE ANTIBODY
Hep B S Ab Interp: NEGATIVE — AB
Hep B S Ab: <3.1 m[IU]/mL — ABNORMAL LOW (ref >10.0)

## 2022-10-08 LAB — CALCIUM, IONIZED: Calcium, Ionized: 0.63 MMOL/L — CL (ref 1.15–1.33)

## 2022-10-08 LAB — LACTIC ACID: Lactic Acid, Plasma: 19.1 MMOL/L (ref 0.4–2.0)

## 2022-10-08 MED ORDER — SODIUM BICARBONATE 8.4 % IV SOLN
8.4 | INTRAVENOUS | Status: DC
Start: 2022-10-08 — End: 2022-10-07

## 2022-10-08 MED ORDER — PHYTONADIONE 10 MG/ML IJ SOLN
10 | Freq: Once | INTRAMUSCULAR | Status: DC
Start: 2022-10-08 — End: 2022-10-08

## 2022-10-08 MED FILL — NOREPINEPHRINE-SODIUM CHLORIDE 16-0.9 MG/250ML-% IV SOLN: INTRAVENOUS | Qty: 250

## 2022-10-08 MED FILL — VASOSTRICT 20 UNIT/ML IV SOLN: 20 UNIT/ML | INTRAVENOUS | Qty: 1

## 2022-10-08 MED FILL — SODIUM BICARBONATE 8.4 % IV SOLN: 8.4 % | INTRAVENOUS | Qty: 100

## 2022-10-09 LAB — CULTURE, RESPIRATORY: Culture: NORMAL

## 2022-10-11 LAB — CULTURE, BODY FLUID
Culture: NO GROWTH
Gram Stain: NONE SEEN
Gram Stain: NONE SEEN

## 2022-10-13 LAB — CULTURE, BLOOD 1: Culture: NO GROWTH

## 2022-10-13 LAB — CULTURE, BLOOD 2: Culture: NO GROWTH

## 2022-10-28 DEATH — deceased
